# Patient Record
Sex: Male | Born: 1975 | Race: White | Hispanic: No | Marital: Single | Smoking: Current every day smoker
Health system: Southern US, Community
[De-identification: ages and names within clinical notes are randomized; demographics above are authoritative.]

## PROBLEM LIST (undated history)

## (undated) DIAGNOSIS — F209 Schizophrenia, unspecified: Secondary | ICD-10-CM

## (undated) DIAGNOSIS — F909 Attention-deficit hyperactivity disorder, unspecified type: Secondary | ICD-10-CM

## (undated) DIAGNOSIS — E785 Hyperlipidemia, unspecified: Secondary | ICD-10-CM

## (undated) HISTORY — PX: ELBOW SURGERY: SHX618

---

## 2005-06-27 ENCOUNTER — Emergency Department: Payer: Self-pay | Admitting: Emergency Medicine

## 2005-06-30 ENCOUNTER — Emergency Department: Payer: Self-pay | Admitting: General Practice

## 2006-01-27 ENCOUNTER — Emergency Department: Payer: Self-pay | Admitting: Emergency Medicine

## 2006-02-04 ENCOUNTER — Emergency Department: Payer: Self-pay | Admitting: Emergency Medicine

## 2006-04-03 ENCOUNTER — Emergency Department: Payer: Self-pay | Admitting: Unknown Physician Specialty

## 2006-08-06 ENCOUNTER — Emergency Department: Payer: Self-pay | Admitting: Emergency Medicine

## 2006-09-02 ENCOUNTER — Emergency Department: Payer: Self-pay | Admitting: Emergency Medicine

## 2006-12-10 ENCOUNTER — Emergency Department: Payer: Self-pay | Admitting: Emergency Medicine

## 2007-12-02 ENCOUNTER — Other Ambulatory Visit: Payer: Self-pay

## 2007-12-02 ENCOUNTER — Inpatient Hospital Stay: Payer: Self-pay | Admitting: Internal Medicine

## 2008-01-07 ENCOUNTER — Inpatient Hospital Stay: Payer: Self-pay | Admitting: Psychiatry

## 2008-01-07 ENCOUNTER — Other Ambulatory Visit: Payer: Self-pay

## 2008-03-28 ENCOUNTER — Emergency Department: Payer: Self-pay | Admitting: Emergency Medicine

## 2008-04-02 ENCOUNTER — Inpatient Hospital Stay: Payer: Self-pay | Admitting: Internal Medicine

## 2008-04-02 ENCOUNTER — Other Ambulatory Visit: Payer: Self-pay

## 2008-04-03 ENCOUNTER — Other Ambulatory Visit: Payer: Self-pay

## 2008-11-04 ENCOUNTER — Inpatient Hospital Stay: Payer: Self-pay | Admitting: Psychiatry

## 2009-06-07 ENCOUNTER — Emergency Department: Payer: Self-pay | Admitting: Emergency Medicine

## 2009-07-02 ENCOUNTER — Emergency Department: Payer: Self-pay | Admitting: Emergency Medicine

## 2009-07-30 ENCOUNTER — Emergency Department: Payer: Self-pay

## 2010-07-15 ENCOUNTER — Emergency Department: Payer: Self-pay | Admitting: Emergency Medicine

## 2010-09-09 ENCOUNTER — Emergency Department: Payer: Self-pay | Admitting: Emergency Medicine

## 2010-09-19 ENCOUNTER — Emergency Department: Payer: Self-pay | Admitting: Emergency Medicine

## 2010-10-14 ENCOUNTER — Inpatient Hospital Stay: Payer: Self-pay | Admitting: Psychiatry

## 2012-01-04 ENCOUNTER — Ambulatory Visit: Payer: Self-pay | Admitting: Internal Medicine

## 2012-01-20 ENCOUNTER — Inpatient Hospital Stay: Payer: Self-pay | Admitting: Internal Medicine

## 2012-01-20 LAB — DRUG SCREEN, URINE
Amphetamines, Ur Screen: NEGATIVE (ref ?–1000)
Barbiturates, Ur Screen: NEGATIVE (ref ?–200)
Benzodiazepine, Ur Scrn: NEGATIVE (ref ?–200)
Methadone, Ur Screen: NEGATIVE (ref ?–300)
Opiate, Ur Screen: POSITIVE (ref ?–300)
Phencyclidine (PCP) Ur S: POSITIVE (ref ?–25)
Tricyclic, Ur Screen: NEGATIVE (ref ?–1000)

## 2012-01-20 LAB — CBC
MCHC: 32.2 g/dL (ref 32.0–36.0)
MCV: 91 fL (ref 80–100)
Platelet: 159 10*3/uL (ref 150–440)
RBC: 4.34 10*6/uL — ABNORMAL LOW (ref 4.40–5.90)
RDW: 14.6 % — ABNORMAL HIGH (ref 11.5–14.5)
WBC: 11.5 10*3/uL — ABNORMAL HIGH (ref 3.8–10.6)

## 2012-01-20 LAB — TSH: Thyroid Stimulating Horm: 0.93 u[IU]/mL

## 2012-01-20 LAB — CK TOTAL AND CKMB (NOT AT ARMC)
CK, Total: 94553 U/L — ABNORMAL HIGH (ref 35–232)
CK-MB: 13.7 ng/mL — ABNORMAL HIGH (ref 0.5–3.6)

## 2012-01-20 LAB — COMPREHENSIVE METABOLIC PANEL
Anion Gap: 15 (ref 7–16)
BUN: 30 mg/dL — ABNORMAL HIGH (ref 7–18)
Bilirubin,Total: 0.4 mg/dL (ref 0.2–1.0)
Chloride: 115 mmol/L — ABNORMAL HIGH (ref 98–107)
Co2: 16 mmol/L — ABNORMAL LOW (ref 21–32)
Creatinine: 3.88 mg/dL — ABNORMAL HIGH (ref 0.60–1.30)
EGFR (African American): 22 — ABNORMAL LOW
EGFR (Non-African Amer.): 19 — ABNORMAL LOW
Glucose: 107 mg/dL — ABNORMAL HIGH (ref 65–99)
Osmolality: 297 (ref 275–301)
Potassium: 3.7 mmol/L (ref 3.5–5.1)
SGOT(AST): 966 U/L — ABNORMAL HIGH (ref 15–37)
SGPT (ALT): 185 U/L — ABNORMAL HIGH
Sodium: 146 mmol/L — ABNORMAL HIGH (ref 136–145)
Total Protein: 6.9 g/dL (ref 6.4–8.2)

## 2012-01-20 LAB — ACETAMINOPHEN LEVEL: Acetaminophen: <2 ug/mL

## 2012-01-20 LAB — SALICYLATE LEVEL: Salicylates, Serum: <1.7 mg/dL

## 2012-01-20 LAB — ETHANOL
Ethanol %: <0.003 % (ref 0.000–0.080)
Ethanol: <3 mg/dL

## 2012-01-20 LAB — URINALYSIS, COMPLETE
Leukocyte Esterase: NEGATIVE
Nitrite: NEGATIVE
Ph: 5 (ref 4.5–8.0)
Protein: 100
RBC,UR: 1 /HPF (ref 0–5)
Squamous Epithelial: <1

## 2012-01-20 LAB — PROTIME-INR: INR: 1

## 2012-01-20 LAB — TROPONIN I: Troponin-I: 0.41 ng/mL — ABNORMAL HIGH

## 2012-01-21 DIAGNOSIS — I369 Nonrheumatic tricuspid valve disorder, unspecified: Secondary | ICD-10-CM

## 2012-01-21 LAB — BASIC METABOLIC PANEL
Anion Gap: 11 (ref 7–16)
BUN: 38 mg/dL — ABNORMAL HIGH (ref 7–18)
Calcium, Total: 6.5 mg/dL — CL (ref 8.5–10.1)
Chloride: 115 mmol/L — ABNORMAL HIGH (ref 98–107)
Chloride: 112 mmol/L — ABNORMAL HIGH (ref 98–107)
Co2: 22 mmol/L (ref 21–32)
Co2: 25 mmol/L (ref 21–32)
Creatinine: 5.45 mg/dL — ABNORMAL HIGH (ref 0.60–1.30)
Creatinine: 5.98 mg/dL — ABNORMAL HIGH (ref 0.60–1.30)
EGFR (African American): 17 — ABNORMAL LOW
EGFR (African American): 15 — ABNORMAL LOW
EGFR (African American): 13 — ABNORMAL LOW
EGFR (Non-African Amer.): 15 — ABNORMAL LOW
Glucose: 146 mg/dL — ABNORMAL HIGH (ref 65–99)
Osmolality: 306 (ref 275–301)
Osmolality: 298 (ref 275–301)
Potassium: 3.5 mmol/L (ref 3.5–5.1)
Potassium: 3.1 mmol/L — ABNORMAL LOW (ref 3.5–5.1)
Potassium: 3.6 mmol/L (ref 3.5–5.1)
Sodium: 146 mmol/L — ABNORMAL HIGH (ref 136–145)

## 2012-01-21 LAB — TROPONIN I
Troponin-I: 0.48 ng/mL — ABNORMAL HIGH
Troponin-I: 0.32 ng/mL — ABNORMAL HIGH

## 2012-01-21 LAB — CBC WITH DIFFERENTIAL/PLATELET
Basophil #: 0 10*3/uL (ref 0.0–0.1)
Eosinophil #: 0 10*3/uL (ref 0.0–0.7)
Eosinophil %: 0 %
Lymphocyte #: 1.4 10*3/uL (ref 1.0–3.6)
MCH: 29 pg (ref 26.0–34.0)
MCHC: 31.7 g/dL — ABNORMAL LOW (ref 32.0–36.0)
MCV: 91 fL (ref 80–100)
Monocyte #: 0.8 x10 3/mm (ref 0.2–1.0)
Neutrophil %: 75.1 %
Platelet: 82 10*3/uL — ABNORMAL LOW (ref 150–440)
RBC: 4.09 10*6/uL — ABNORMAL LOW (ref 4.40–5.90)
RDW: 14.7 % — ABNORMAL HIGH (ref 11.5–14.5)

## 2012-01-21 LAB — CK TOTAL AND CKMB (NOT AT ARMC): CK-MB: 22.1 ng/mL — ABNORMAL HIGH (ref 0.5–3.6)

## 2012-01-22 LAB — CBC WITH DIFFERENTIAL/PLATELET
Basophil #: 0 10*3/uL (ref 0.0–0.1)
Comment - H1-Com2: NORMAL
Eosinophil %: 0.7 %
Eosinophil: 1 %
HCT: 32.9 % — ABNORMAL LOW (ref 40.0–52.0)
HGB: 10.8 g/dL — ABNORMAL LOW (ref 13.0–18.0)
Lymphocyte #: 1.8 10*3/uL (ref 1.0–3.6)
MCH: 29.5 pg (ref 26.0–34.0)
Monocyte #: 0.4 x10 3/mm (ref 0.2–1.0)
Monocyte %: 4.6 %
Monocytes: 6 %
Neutrophil #: 7.2 10*3/uL — ABNORMAL HIGH (ref 1.4–6.5)
RBC: 3.67 10*6/uL — ABNORMAL LOW (ref 4.40–5.90)
RDW: 14.1 % (ref 11.5–14.5)
Segmented Neutrophils: 67 %
WBC: 9.6 10*3/uL (ref 3.8–10.6)

## 2012-01-22 LAB — URINE CULTURE

## 2012-01-22 LAB — BASIC METABOLIC PANEL
BUN: 37 mg/dL — ABNORMAL HIGH (ref 7–18)
Calcium, Total: 6.4 mg/dL — CL (ref 8.5–10.1)
Co2: 28 mmol/L (ref 21–32)
Creatinine: 6.74 mg/dL — ABNORMAL HIGH (ref 0.60–1.30)
EGFR (African American): 11 — ABNORMAL LOW
Glucose: 101 mg/dL — ABNORMAL HIGH (ref 65–99)
Sodium: 138 mmol/L (ref 136–145)

## 2012-01-22 LAB — COMPREHENSIVE METABOLIC PANEL
Albumin: 2.3 g/dL — ABNORMAL LOW (ref 3.4–5.0)
Alkaline Phosphatase: 62 U/L (ref 50–136)
Anion Gap: 9 (ref 7–16)
BUN: 37 mg/dL — ABNORMAL HIGH (ref 7–18)
Bilirubin,Total: 0.5 mg/dL (ref 0.2–1.0)
Calcium, Total: 6.1 mg/dL — CL (ref 8.5–10.1)
Creatinine: 6.33 mg/dL — ABNORMAL HIGH (ref 0.60–1.30)
EGFR (Non-African Amer.): 11 — ABNORMAL LOW
Glucose: 115 mg/dL — ABNORMAL HIGH (ref 65–99)
SGPT (ALT): 215 U/L — ABNORMAL HIGH
Total Protein: 5.2 g/dL — ABNORMAL LOW (ref 6.4–8.2)

## 2012-01-22 LAB — URIC ACID: Uric Acid: 11.5 mg/dL — ABNORMAL HIGH (ref 3.5–7.2)

## 2012-01-22 LAB — RETICULOCYTES
Absolute Retic Count: 0.067 10*6/uL (ref 0.031–0.129)
Reticulocyte: 1.8 % (ref 0.7–2.5)

## 2012-01-22 LAB — PROTIME-INR
INR: 0.9
Prothrombin Time: 12.8 secs (ref 11.5–14.7)

## 2012-01-22 LAB — APTT: Activated PTT: 34 secs (ref 23.6–35.9)

## 2012-01-22 LAB — LACTATE DEHYDROGENASE: LDH: 2384 U/L — ABNORMAL HIGH (ref 81–234)

## 2012-01-22 LAB — IRON AND TIBC: Iron Saturation: 42 %

## 2012-01-22 LAB — PHOSPHORUS: Phosphorus: 3.1 mg/dL (ref 2.5–4.9)

## 2012-01-22 LAB — CK: CK, Total: 59866 U/L — ABNORMAL HIGH (ref 35–232)

## 2012-01-22 LAB — FOLATE: Folic Acid: 13.9 ng/mL (ref 3.1–100.0)

## 2012-01-22 LAB — RAPID HIV-1/2 QL/CONFIRM: HIV-1/2,Rapid Ql: NEGATIVE

## 2012-01-23 LAB — CBC WITH DIFFERENTIAL/PLATELET
Basophil #: 0 10*3/uL (ref 0.0–0.1)
Basophil %: 0.3 %
Eosinophil #: 0.1 10*3/uL (ref 0.0–0.7)
HCT: 33.1 % — ABNORMAL LOW (ref 40.0–52.0)
Lymphocyte #: 1.1 10*3/uL (ref 1.0–3.6)
Lymphocyte %: 15.1 %
MCH: 29.8 pg (ref 26.0–34.0)
MCHC: 33.4 g/dL (ref 32.0–36.0)
MCV: 89 fL (ref 80–100)
Monocyte #: 0.4 x10 3/mm (ref 0.2–1.0)
Neutrophil #: 5.7 10*3/uL (ref 1.4–6.5)
Platelet: 85 10*3/uL — ABNORMAL LOW (ref 150–440)
RDW: 13.9 % (ref 11.5–14.5)
WBC: 7.4 10*3/uL (ref 3.8–10.6)

## 2012-01-23 LAB — COMPREHENSIVE METABOLIC PANEL
Albumin: 2.4 g/dL — ABNORMAL LOW (ref 3.4–5.0)
Alkaline Phosphatase: 68 U/L (ref 50–136)
Anion Gap: 8 (ref 7–16)
Bilirubin,Total: 0.8 mg/dL (ref 0.2–1.0)
Calcium, Total: 6.6 mg/dL — CL (ref 8.5–10.1)
Chloride: 103 mmol/L (ref 98–107)
Creatinine: 7.55 mg/dL — ABNORMAL HIGH (ref 0.60–1.30)
EGFR (Non-African Amer.): 8 — ABNORMAL LOW
Glucose: 94 mg/dL (ref 65–99)
Osmolality: 291 (ref 275–301)
Potassium: 3.4 mmol/L — ABNORMAL LOW (ref 3.5–5.1)
Sodium: 141 mmol/L (ref 136–145)
Total Protein: 5.1 g/dL — ABNORMAL LOW (ref 6.4–8.2)

## 2012-01-23 LAB — CK: CK, Total: 44049 U/L — ABNORMAL HIGH (ref 35–232)

## 2012-01-24 LAB — BASIC METABOLIC PANEL
Calcium, Total: 7.6 mg/dL — ABNORMAL LOW (ref 8.5–10.1)
Chloride: 100 mmol/L (ref 98–107)
Co2: 29 mmol/L (ref 21–32)
EGFR (African American): 12 — ABNORMAL LOW
EGFR (Non-African Amer.): 10 — ABNORMAL LOW
Glucose: 109 mg/dL — ABNORMAL HIGH (ref 65–99)
Osmolality: 290 (ref 275–301)
Sodium: 139 mmol/L (ref 136–145)

## 2012-01-24 LAB — CBC WITH DIFFERENTIAL/PLATELET
Basophil #: 0 10*3/uL (ref 0.0–0.1)
Basophil %: 0.1 %
Eosinophil #: 0.3 10*3/uL (ref 0.0–0.7)
Eosinophil %: 4.6 %
HCT: 32.8 % — ABNORMAL LOW (ref 40.0–52.0)
HGB: 11.1 g/dL — ABNORMAL LOW (ref 13.0–18.0)
Lymphocyte #: 0.8 10*3/uL — ABNORMAL LOW (ref 1.0–3.6)
Lymphocyte %: 10.9 %
MCH: 29.9 pg (ref 26.0–34.0)
MCV: 88 fL (ref 80–100)
Monocyte %: 5.1 %
Platelet: 106 10*3/uL — ABNORMAL LOW (ref 150–440)
RBC: 3.71 10*6/uL — ABNORMAL LOW (ref 4.40–5.90)
RDW: 13.7 % (ref 11.5–14.5)
WBC: 7.3 10*3/uL (ref 3.8–10.6)

## 2012-01-24 LAB — RENAL FUNCTION PANEL
Albumin: 2.3 g/dL — ABNORMAL LOW (ref 3.4–5.0)
Anion Gap: 10 (ref 7–16)
Chloride: 100 mmol/L (ref 98–107)
EGFR (African American): 12 — ABNORMAL LOW
EGFR (Non-African Amer.): 10 — ABNORMAL LOW
Potassium: 3.5 mmol/L (ref 3.5–5.1)
Sodium: 139 mmol/L (ref 136–145)

## 2012-01-24 LAB — URIC ACID: Uric Acid: 11.9 mg/dL — ABNORMAL HIGH (ref 3.5–7.2)

## 2012-01-24 LAB — LACTATE DEHYDROGENASE: LDH: 864 U/L — ABNORMAL HIGH (ref 81–234)

## 2012-01-24 LAB — CK: CK, Total: 17988 U/L — ABNORMAL HIGH (ref 35–232)

## 2012-01-25 LAB — CBC WITH DIFFERENTIAL/PLATELET
Eosinophil #: 0.4 10*3/uL (ref 0.0–0.7)
HCT: 32.3 % — ABNORMAL LOW (ref 40.0–52.0)
Lymphocyte #: 0.9 10*3/uL — ABNORMAL LOW (ref 1.0–3.6)
Lymphocyte %: 12.3 %
MCH: 30.2 pg (ref 26.0–34.0)
MCHC: 33.5 g/dL (ref 32.0–36.0)
MCV: 90 fL (ref 80–100)
Monocyte #: 0.5 x10 3/mm (ref 0.2–1.0)
Neutrophil #: 5.2 10*3/uL (ref 1.4–6.5)
Platelet: 123 10*3/uL — ABNORMAL LOW (ref 150–440)
RDW: 13.4 % (ref 11.5–14.5)

## 2012-01-25 LAB — RENAL FUNCTION PANEL
Albumin: 2.3 g/dL — ABNORMAL LOW (ref 3.4–5.0)
Anion Gap: 11 (ref 7–16)
BUN: 37 mg/dL — ABNORMAL HIGH (ref 7–18)
Chloride: 104 mmol/L (ref 98–107)
Co2: 27 mmol/L (ref 21–32)
Creatinine: 4.93 mg/dL — ABNORMAL HIGH (ref 0.60–1.30)
EGFR (African American): 16 — ABNORMAL LOW
EGFR (Non-African Amer.): 14 — ABNORMAL LOW
Potassium: 3.7 mmol/L (ref 3.5–5.1)
Sodium: 142 mmol/L (ref 136–145)

## 2012-01-25 LAB — COMPREHENSIVE METABOLIC PANEL
Alkaline Phosphatase: 64 U/L (ref 50–136)
Bilirubin,Total: 0.5 mg/dL (ref 0.2–1.0)
SGOT(AST): 168 U/L — ABNORMAL HIGH (ref 15–37)
Total Protein: 5.6 g/dL — ABNORMAL LOW (ref 6.4–8.2)

## 2012-01-25 LAB — CK: CK, Total: 10607 U/L — ABNORMAL HIGH (ref 35–232)

## 2012-01-25 LAB — URIC ACID: Uric Acid: 11.2 mg/dL — ABNORMAL HIGH (ref 3.5–7.2)

## 2012-01-26 LAB — CREATININE, SERUM: Creatinine: 3.48 mg/dL — ABNORMAL HIGH (ref 0.60–1.30)

## 2012-01-26 LAB — CULTURE, BLOOD (SINGLE)

## 2012-01-26 LAB — CK: CK, Total: 5929 U/L — ABNORMAL HIGH (ref 35–232)

## 2012-01-27 LAB — RENAL FUNCTION PANEL
Anion Gap: 5 — ABNORMAL LOW (ref 7–16)
BUN: 19 mg/dL — ABNORMAL HIGH (ref 7–18)
EGFR (African American): 38 — ABNORMAL LOW
EGFR (Non-African Amer.): 33 — ABNORMAL LOW
Glucose: 125 mg/dL — ABNORMAL HIGH (ref 65–99)
Phosphorus: 4.2 mg/dL (ref 2.5–4.9)
Potassium: 3.6 mmol/L (ref 3.5–5.1)
Sodium: 140 mmol/L (ref 136–145)

## 2012-01-27 LAB — CBC WITH DIFFERENTIAL/PLATELET
Basophil %: 0.5 %
Eosinophil %: 4.5 %
HGB: 10.3 g/dL — ABNORMAL LOW (ref 13.0–18.0)
Lymphocyte #: 1 10*3/uL (ref 1.0–3.6)
Lymphocyte %: 18.2 %
MCH: 29.4 pg (ref 26.0–34.0)
MCHC: 32.5 g/dL (ref 32.0–36.0)
Monocyte #: 0.6 x10 3/mm (ref 0.2–1.0)
Monocyte %: 10.7 %
Neutrophil %: 66.1 %
Platelet: 210 10*3/uL (ref 150–440)
RDW: 13.9 % (ref 11.5–14.5)

## 2012-01-28 LAB — BASIC METABOLIC PANEL
Anion Gap: 8 (ref 7–16)
BUN: 18 mg/dL (ref 7–18)
Co2: 24 mmol/L (ref 21–32)
Creatinine: 1.88 mg/dL — ABNORMAL HIGH (ref 0.60–1.30)
EGFR (African American): 53 — ABNORMAL LOW
EGFR (Non-African Amer.): 46 — ABNORMAL LOW
Glucose: 92 mg/dL (ref 65–99)
Osmolality: 281 (ref 275–301)
Sodium: 140 mmol/L (ref 136–145)

## 2012-01-28 LAB — CK: CK, Total: 1646 U/L — ABNORMAL HIGH (ref 35–232)

## 2012-02-04 ENCOUNTER — Ambulatory Visit: Payer: Self-pay | Admitting: Internal Medicine

## 2012-03-22 ENCOUNTER — Emergency Department: Payer: Self-pay | Admitting: Emergency Medicine

## 2012-03-22 LAB — DRUG SCREEN, URINE
Barbiturates, Ur Screen: NEGATIVE (ref ?–200)
Benzodiazepine, Ur Scrn: NEGATIVE (ref ?–200)
Cocaine Metabolite,Ur ~~LOC~~: NEGATIVE (ref ?–300)
MDMA (Ecstasy)Ur Screen: NEGATIVE (ref ?–500)
Methadone, Ur Screen: NEGATIVE (ref ?–300)
Opiate, Ur Screen: NEGATIVE (ref ?–300)
Phencyclidine (PCP) Ur S: POSITIVE (ref ?–25)
Tricyclic, Ur Screen: NEGATIVE (ref ?–1000)

## 2012-03-22 LAB — COMPREHENSIVE METABOLIC PANEL
Alkaline Phosphatase: 132 U/L (ref 50–136)
Anion Gap: 10 (ref 7–16)
BUN: 25 mg/dL — ABNORMAL HIGH (ref 7–18)
Bilirubin,Total: 1 mg/dL (ref 0.2–1.0)
Calcium, Total: 9.1 mg/dL (ref 8.5–10.1)
Chloride: 108 mmol/L — ABNORMAL HIGH (ref 98–107)
Co2: 22 mmol/L (ref 21–32)
Creatinine: 1.49 mg/dL — ABNORMAL HIGH (ref 0.60–1.30)
EGFR (Non-African Amer.): >60
Osmolality: 284 (ref 275–301)
Potassium: 4 mmol/L (ref 3.5–5.1)
Sodium: 140 mmol/L (ref 136–145)

## 2012-03-22 LAB — CBC
HCT: 37.8 % — ABNORMAL LOW (ref 40.0–52.0)
HGB: 12.9 g/dL — ABNORMAL LOW (ref 13.0–18.0)
MCHC: 34.3 g/dL (ref 32.0–36.0)
MCV: 88 fL (ref 80–100)
RDW: 14.4 % (ref 11.5–14.5)
WBC: 11.5 10*3/uL — ABNORMAL HIGH (ref 3.8–10.6)

## 2012-03-22 LAB — URINALYSIS, COMPLETE
Bilirubin,UR: NEGATIVE
Blood: NEGATIVE
Hyaline Cast: 9
Nitrite: NEGATIVE
Protein: 100
Specific Gravity: 1.031 (ref 1.003–1.030)
Squamous Epithelial: NONE SEEN
WBC UR: 9 /HPF (ref 0–5)

## 2012-03-22 LAB — ETHANOL
Ethanol %: <0.003 % (ref 0.000–0.080)
Ethanol: <3 mg/dL

## 2012-11-05 ENCOUNTER — Emergency Department: Payer: Self-pay | Admitting: Emergency Medicine

## 2012-11-05 LAB — COMPREHENSIVE METABOLIC PANEL
Albumin: 4.2 g/dL (ref 3.4–5.0)
Alkaline Phosphatase: 101 U/L (ref 50–136)
Anion Gap: 14 (ref 7–16)
BUN: 24 mg/dL — ABNORMAL HIGH (ref 7–18)
Co2: 19 mmol/L — ABNORMAL LOW (ref 21–32)
Creatinine: 1.46 mg/dL — ABNORMAL HIGH (ref 0.60–1.30)
EGFR (African American): >60
Osmolality: 282 (ref 275–301)
Potassium: 3.4 mmol/L — ABNORMAL LOW (ref 3.5–5.1)
SGOT(AST): 84 U/L — ABNORMAL HIGH (ref 15–37)
SGPT (ALT): 46 U/L (ref 12–78)
Total Protein: 8 g/dL (ref 6.4–8.2)

## 2012-11-05 LAB — BASIC METABOLIC PANEL
Calcium, Total: 8.3 mg/dL — ABNORMAL LOW (ref 8.5–10.1)
Chloride: 110 mmol/L — ABNORMAL HIGH (ref 98–107)
Co2: 23 mmol/L (ref 21–32)
Creatinine: 1.1 mg/dL (ref 0.60–1.30)
EGFR (African American): >60
EGFR (Non-African Amer.): >60
Glucose: 91 mg/dL (ref 65–99)
Osmolality: 279 (ref 275–301)
Potassium: 3.8 mmol/L (ref 3.5–5.1)

## 2012-11-05 LAB — ETHANOL: Ethanol: <3 mg/dL

## 2012-11-05 LAB — URINALYSIS, COMPLETE
Nitrite: NEGATIVE
Ph: 5 (ref 4.5–8.0)
Protein: 30
WBC UR: 3 /HPF (ref 0–5)

## 2012-11-05 LAB — CBC
HCT: 41.3 % (ref 40.0–52.0)
HGB: 14.1 g/dL (ref 13.0–18.0)
MCH: 29.4 pg (ref 26.0–34.0)
MCHC: 34 g/dL (ref 32.0–36.0)
MCV: 86 fL (ref 80–100)
Platelet: 236 10*3/uL (ref 150–440)
RDW: 13.3 % (ref 11.5–14.5)
WBC: 14.5 10*3/uL — ABNORMAL HIGH (ref 3.8–10.6)

## 2012-11-05 LAB — CK
CK, Total: 4553 U/L — ABNORMAL HIGH (ref 35–232)
CK, Total: 4279 U/L — ABNORMAL HIGH (ref 35–232)

## 2012-11-05 LAB — DRUG SCREEN, URINE
Amphetamines, Ur Screen: NEGATIVE (ref ?–1000)
Barbiturates, Ur Screen: NEGATIVE (ref ?–200)
Benzodiazepine, Ur Scrn: NEGATIVE (ref ?–200)
Cocaine Metabolite,Ur ~~LOC~~: NEGATIVE (ref ?–300)
MDMA (Ecstasy)Ur Screen: NEGATIVE (ref ?–500)
Methadone, Ur Screen: NEGATIVE (ref ?–300)
Opiate, Ur Screen: NEGATIVE (ref ?–300)

## 2012-11-05 LAB — TSH: Thyroid Stimulating Horm: 2.71 u[IU]/mL

## 2012-11-05 LAB — SALICYLATE LEVEL: Salicylates, Serum: 1.8 mg/dL

## 2012-11-06 LAB — CK
CK, Total: 5725 U/L — ABNORMAL HIGH (ref 35–232)
CK, Total: 4258 U/L — ABNORMAL HIGH (ref 35–232)

## 2012-11-06 LAB — BASIC METABOLIC PANEL
Anion Gap: 9 (ref 7–16)
BUN: 11 mg/dL (ref 7–18)
Creatinine: 1.11 mg/dL (ref 0.60–1.30)
EGFR (African American): >60
Osmolality: 275 (ref 275–301)

## 2012-11-08 LAB — CBC WITH DIFFERENTIAL/PLATELET
Basophil %: 0.2 %
Eosinophil #: 0.2 10*3/uL (ref 0.0–0.7)
Eosinophil %: 2.2 %
HCT: 41.9 % (ref 40.0–52.0)
HGB: 14.2 g/dL (ref 13.0–18.0)
Lymphocyte #: 0.9 10*3/uL — ABNORMAL LOW (ref 1.0–3.6)
Lymphocyte %: 10.6 %
MCH: 29.6 pg (ref 26.0–34.0)
MCHC: 33.8 g/dL (ref 32.0–36.0)
Monocyte #: 0.7 x10 3/mm (ref 0.2–1.0)
Neutrophil %: 78.6 %
Platelet: 205 10*3/uL (ref 150–440)
RBC: 4.79 10*6/uL (ref 4.40–5.90)
RDW: 13.5 % (ref 11.5–14.5)
WBC: 8.5 10*3/uL (ref 3.8–10.6)

## 2012-11-08 LAB — COMPREHENSIVE METABOLIC PANEL
Albumin: 4.1 g/dL (ref 3.4–5.0)
Anion Gap: 5 — ABNORMAL LOW (ref 7–16)
BUN: 11 mg/dL (ref 7–18)
Bilirubin,Total: 0.5 mg/dL (ref 0.2–1.0)
Calcium, Total: 9.4 mg/dL (ref 8.5–10.1)
Chloride: 106 mmol/L (ref 98–107)
Co2: 26 mmol/L (ref 21–32)
Creatinine: 0.97 mg/dL (ref 0.60–1.30)
EGFR (African American): >60
EGFR (Non-African Amer.): >60
Potassium: 4.3 mmol/L (ref 3.5–5.1)
SGOT(AST): 72 U/L — ABNORMAL HIGH (ref 15–37)
SGPT (ALT): 47 U/L (ref 12–78)
Sodium: 137 mmol/L (ref 136–145)
Total Protein: 8.2 g/dL (ref 6.4–8.2)

## 2012-11-08 LAB — URINALYSIS, COMPLETE
Bacteria: NONE SEEN
Bilirubin,UR: NEGATIVE
Blood: NEGATIVE
Nitrite: NEGATIVE
Squamous Epithelial: NONE SEEN

## 2012-11-08 LAB — DRUG SCREEN, URINE
Amphetamines, Ur Screen: NEGATIVE (ref ?–1000)
Barbiturates, Ur Screen: NEGATIVE (ref ?–200)
Benzodiazepine, Ur Scrn: POSITIVE (ref ?–200)
Cannabinoid 50 Ng, Ur ~~LOC~~: NEGATIVE (ref ?–50)
Opiate, Ur Screen: NEGATIVE (ref ?–300)

## 2012-11-12 ENCOUNTER — Emergency Department: Payer: Self-pay | Admitting: Unknown Physician Specialty

## 2012-11-12 LAB — ETHANOL
Ethanol %: <0.003 % (ref 0.000–0.080)
Ethanol: <3 mg/dL

## 2012-11-12 LAB — COMPREHENSIVE METABOLIC PANEL
Albumin: 4.3 g/dL (ref 3.4–5.0)
Anion Gap: 11 (ref 7–16)
BUN: 40 mg/dL — ABNORMAL HIGH (ref 7–18)
Co2: 23 mmol/L (ref 21–32)
EGFR (African American): >60
Glucose: 104 mg/dL — ABNORMAL HIGH (ref 65–99)
SGOT(AST): 72 U/L — ABNORMAL HIGH (ref 15–37)
Sodium: 141 mmol/L (ref 136–145)
Total Protein: 8.5 g/dL — ABNORMAL HIGH (ref 6.4–8.2)

## 2012-11-12 LAB — CK: CK, Total: 2534 U/L — ABNORMAL HIGH (ref 35–232)

## 2012-11-12 LAB — CBC
HGB: 13.5 g/dL (ref 13.0–18.0)
MCH: 29.5 pg (ref 26.0–34.0)
MCHC: 33.8 g/dL (ref 32.0–36.0)
RDW: 13.5 % (ref 11.5–14.5)

## 2012-11-12 LAB — TSH: Thyroid Stimulating Horm: 1.06 u[IU]/mL

## 2012-11-13 LAB — DRUG SCREEN, URINE
Barbiturates, Ur Screen: NEGATIVE (ref ?–200)
Benzodiazepine, Ur Scrn: POSITIVE (ref ?–200)
MDMA (Ecstasy)Ur Screen: NEGATIVE (ref ?–500)
Opiate, Ur Screen: NEGATIVE (ref ?–300)
Phencyclidine (PCP) Ur S: NEGATIVE (ref ?–25)

## 2012-11-13 LAB — URINALYSIS, COMPLETE
Bacteria: NONE SEEN
Bilirubin,UR: NEGATIVE
Glucose,UR: NEGATIVE mg/dL (ref 0–75)
Leukocyte Esterase: NEGATIVE
Nitrite: NEGATIVE
Ph: 5 (ref 4.5–8.0)
RBC,UR: <1 /HPF (ref 0–5)
Squamous Epithelial: NONE SEEN
WBC UR: 1 /HPF (ref 0–5)

## 2012-11-13 LAB — BASIC METABOLIC PANEL
Anion Gap: 8 (ref 7–16)
BUN: 29 mg/dL — ABNORMAL HIGH (ref 7–18)
Chloride: 113 mmol/L — ABNORMAL HIGH (ref 98–107)
Creatinine: 1.01 mg/dL (ref 0.60–1.30)
Glucose: 77 mg/dL (ref 65–99)
Sodium: 142 mmol/L (ref 136–145)

## 2012-11-13 LAB — CK: CK, Total: 2691 U/L — ABNORMAL HIGH (ref 35–232)

## 2012-11-14 LAB — COMPREHENSIVE METABOLIC PANEL
Alkaline Phosphatase: 72 U/L (ref 50–136)
Anion Gap: 7 (ref 7–16)
Bilirubin,Total: 1.2 mg/dL — ABNORMAL HIGH (ref 0.2–1.0)
Chloride: 112 mmol/L — ABNORMAL HIGH (ref 98–107)
Co2: 23 mmol/L (ref 21–32)
Creatinine: 1.12 mg/dL (ref 0.60–1.30)
EGFR (African American): >60
Glucose: 77 mg/dL (ref 65–99)
Osmolality: 285 (ref 275–301)
Potassium: 3.8 mmol/L (ref 3.5–5.1)
SGOT(AST): 74 U/L — ABNORMAL HIGH (ref 15–37)
Sodium: 142 mmol/L (ref 136–145)
Total Protein: 6.6 g/dL (ref 6.4–8.2)

## 2012-11-15 LAB — CK: CK, Total: 3049 U/L — ABNORMAL HIGH (ref 35–232)

## 2013-02-07 ENCOUNTER — Emergency Department: Payer: Self-pay | Admitting: Emergency Medicine

## 2013-02-07 LAB — URINALYSIS, COMPLETE
Bacteria: NONE SEEN
Bilirubin,UR: NEGATIVE
Glucose,UR: NEGATIVE mg/dL (ref 0–75)
Leukocyte Esterase: NEGATIVE
Nitrite: NEGATIVE
RBC,UR: NONE SEEN /HPF (ref 0–5)
Specific Gravity: 1.009 (ref 1.003–1.030)
Squamous Epithelial: <1
WBC UR: <1 /HPF (ref 0–5)

## 2013-02-07 LAB — COMPREHENSIVE METABOLIC PANEL
Alkaline Phosphatase: 111 U/L (ref 50–136)
Calcium, Total: 8.3 mg/dL — ABNORMAL LOW (ref 8.5–10.1)
Chloride: 109 mmol/L — ABNORMAL HIGH (ref 98–107)
Co2: 25 mmol/L (ref 21–32)
Creatinine: 1.1 mg/dL (ref 0.60–1.30)
EGFR (African American): >60
Glucose: 98 mg/dL (ref 65–99)
Total Protein: 7.2 g/dL (ref 6.4–8.2)

## 2013-02-07 LAB — DRUG SCREEN, URINE
Amphetamines, Ur Screen: NEGATIVE (ref ?–1000)
Barbiturates, Ur Screen: NEGATIVE (ref ?–200)
Cannabinoid 50 Ng, Ur ~~LOC~~: NEGATIVE (ref ?–50)
MDMA (Ecstasy)Ur Screen: NEGATIVE (ref ?–500)
Methadone, Ur Screen: NEGATIVE (ref ?–300)
Tricyclic, Ur Screen: NEGATIVE (ref ?–1000)

## 2013-02-07 LAB — CBC
HCT: 41 % (ref 40.0–52.0)
HGB: 14.1 g/dL (ref 13.0–18.0)
MCH: 30.4 pg (ref 26.0–34.0)
MCHC: 34.2 g/dL (ref 32.0–36.0)
MCV: 89 fL (ref 80–100)
Platelet: 199 10*3/uL (ref 150–440)
RBC: 4.62 10*6/uL (ref 4.40–5.90)

## 2013-02-07 LAB — TSH: Thyroid Stimulating Horm: 1.9 u[IU]/mL

## 2013-02-07 LAB — SALICYLATE LEVEL: Salicylates, Serum: <1.7 mg/dL

## 2013-02-07 LAB — ETHANOL
Ethanol %: <0.003 % (ref 0.000–0.080)
Ethanol: <3 mg/dL

## 2013-02-10 ENCOUNTER — Emergency Department: Payer: Self-pay | Admitting: Emergency Medicine

## 2013-02-10 LAB — CBC
HCT: 42.4 % (ref 40.0–52.0)
MCH: 29.9 pg (ref 26.0–34.0)
MCHC: 33.9 g/dL (ref 32.0–36.0)
MCV: 88 fL (ref 80–100)
Platelet: 237 10*3/uL (ref 150–440)
RBC: 4.8 10*6/uL (ref 4.40–5.90)

## 2013-02-10 LAB — URINALYSIS, COMPLETE
Bacteria: NONE SEEN
Blood: NEGATIVE
Glucose,UR: NEGATIVE mg/dL (ref 0–75)
Hyaline Cast: 2
Leukocyte Esterase: NEGATIVE
Ph: 5 (ref 4.5–8.0)
Protein: 30
RBC,UR: NONE SEEN /HPF (ref 0–5)
Squamous Epithelial: NONE SEEN

## 2013-02-10 LAB — COMPREHENSIVE METABOLIC PANEL
Anion Gap: 6 — ABNORMAL LOW (ref 7–16)
Calcium, Total: 8.9 mg/dL (ref 8.5–10.1)
Co2: 20 mmol/L — ABNORMAL LOW (ref 21–32)
Creatinine: 1.05 mg/dL (ref 0.60–1.30)
EGFR (African American): >60
EGFR (Non-African Amer.): >60
Osmolality: 284 (ref 275–301)
SGOT(AST): 72 U/L — ABNORMAL HIGH (ref 15–37)
SGPT (ALT): 43 U/L (ref 12–78)

## 2013-02-10 LAB — ETHANOL: Ethanol: <3 mg/dL

## 2013-02-10 LAB — DRUG SCREEN, URINE
Amphetamines, Ur Screen: NEGATIVE (ref ?–1000)
Barbiturates, Ur Screen: NEGATIVE (ref ?–200)
Benzodiazepine, Ur Scrn: NEGATIVE (ref ?–200)
Cocaine Metabolite,Ur ~~LOC~~: NEGATIVE (ref ?–300)
MDMA (Ecstasy)Ur Screen: NEGATIVE (ref ?–500)
Methadone, Ur Screen: NEGATIVE (ref ?–300)
Opiate, Ur Screen: NEGATIVE (ref ?–300)
Tricyclic, Ur Screen: NEGATIVE (ref ?–1000)

## 2013-02-10 LAB — TSH: Thyroid Stimulating Horm: 0.669 u[IU]/mL

## 2013-02-19 ENCOUNTER — Emergency Department: Payer: Self-pay | Admitting: Emergency Medicine

## 2013-02-19 LAB — COMPREHENSIVE METABOLIC PANEL
Albumin: 3.9 g/dL (ref 3.4–5.0)
Alkaline Phosphatase: 123 U/L (ref 50–136)
BUN: 9 mg/dL (ref 7–18)
Calcium, Total: 9 mg/dL (ref 8.5–10.1)
Chloride: 107 mmol/L (ref 98–107)
Co2: 18 mmol/L — ABNORMAL LOW (ref 21–32)
Creatinine: 1.17 mg/dL (ref 0.60–1.30)
EGFR (Non-African Amer.): >60
Osmolality: 273 (ref 275–301)
Potassium: 2.7 mmol/L — ABNORMAL LOW (ref 3.5–5.1)
SGOT(AST): 53 U/L — ABNORMAL HIGH (ref 15–37)
SGPT (ALT): 48 U/L (ref 12–78)
Sodium: 137 mmol/L (ref 136–145)
Total Protein: 7.5 g/dL (ref 6.4–8.2)

## 2013-02-19 LAB — CBC
HGB: 14.6 g/dL (ref 13.0–18.0)
MCHC: 35.2 g/dL (ref 32.0–36.0)
RBC: 4.83 10*6/uL (ref 4.40–5.90)

## 2013-02-19 LAB — URINALYSIS, COMPLETE
Bacteria: NONE SEEN
Bilirubin,UR: NEGATIVE
Leukocyte Esterase: NEGATIVE
Nitrite: NEGATIVE
RBC,UR: 5 /HPF (ref 0–5)
WBC UR: 8 /HPF (ref 0–5)

## 2013-04-10 ENCOUNTER — Emergency Department: Payer: Self-pay | Admitting: Emergency Medicine

## 2013-04-10 LAB — URINALYSIS, COMPLETE
Bacteria: NONE SEEN
Blood: NEGATIVE
Glucose,UR: NEGATIVE mg/dL (ref 0–75)
Ketone: NEGATIVE
Leukocyte Esterase: NEGATIVE
Nitrite: NEGATIVE
Ph: 5 (ref 4.5–8.0)
RBC,UR: NONE SEEN /HPF (ref 0–5)
Specific Gravity: 1.005 (ref 1.003–1.030)
Squamous Epithelial: NONE SEEN
WBC UR: <1 /HPF (ref 0–5)

## 2013-04-10 LAB — COMPREHENSIVE METABOLIC PANEL
Albumin: 3.7 g/dL (ref 3.4–5.0)
Alkaline Phosphatase: 127 U/L (ref 50–136)
Anion Gap: 6 — ABNORMAL LOW (ref 7–16)
BUN: <1 mg/dL — ABNORMAL LOW (ref 7–18)
Bilirubin,Total: 0.3 mg/dL (ref 0.2–1.0)
Calcium, Total: 8.8 mg/dL (ref 8.5–10.1)
Creatinine: 1.03 mg/dL (ref 0.60–1.30)
EGFR (African American): >60
EGFR (Non-African Amer.): >60
Potassium: 3.5 mmol/L (ref 3.5–5.1)
SGPT (ALT): 25 U/L (ref 12–78)
Sodium: 135 mmol/L — ABNORMAL LOW (ref 136–145)
Total Protein: 7 g/dL (ref 6.4–8.2)

## 2013-04-10 LAB — DRUG SCREEN, URINE
Benzodiazepine, Ur Scrn: NEGATIVE (ref ?–200)
Cannabinoid 50 Ng, Ur ~~LOC~~: POSITIVE (ref ?–50)
Cocaine Metabolite,Ur ~~LOC~~: NEGATIVE (ref ?–300)
MDMA (Ecstasy)Ur Screen: NEGATIVE (ref ?–500)
Methadone, Ur Screen: NEGATIVE (ref ?–300)
Phencyclidine (PCP) Ur S: NEGATIVE (ref ?–25)

## 2013-04-10 LAB — CBC
HCT: 39.2 % — ABNORMAL LOW (ref 40.0–52.0)
MCH: 29.8 pg (ref 26.0–34.0)
MCHC: 33.9 g/dL (ref 32.0–36.0)
RDW: 13.7 % (ref 11.5–14.5)

## 2013-04-10 LAB — ETHANOL: Ethanol %: <0.003 % (ref 0.000–0.080)

## 2013-04-23 ENCOUNTER — Emergency Department: Payer: Self-pay | Admitting: Emergency Medicine

## 2013-04-23 LAB — COMPREHENSIVE METABOLIC PANEL
Albumin: 3.7 g/dL (ref 3.4–5.0)
Alkaline Phosphatase: 109 U/L (ref 50–136)
Anion Gap: 8 (ref 7–16)
BUN: 5 mg/dL — ABNORMAL LOW (ref 7–18)
Bilirubin,Total: 0.1 mg/dL — ABNORMAL LOW (ref 0.2–1.0)
Calcium, Total: 8.7 mg/dL (ref 8.5–10.1)
Chloride: 109 mmol/L — ABNORMAL HIGH (ref 98–107)
Co2: 19 mmol/L — ABNORMAL LOW (ref 21–32)
Creatinine: 1.12 mg/dL (ref 0.60–1.30)
EGFR (African American): >60
EGFR (Non-African Amer.): >60
Glucose: 144 mg/dL — ABNORMAL HIGH (ref 65–99)
Sodium: 136 mmol/L (ref 136–145)

## 2013-04-23 LAB — ETHANOL
Ethanol %: <0.003 % (ref 0.000–0.080)
Ethanol: <3 mg/dL

## 2013-04-23 LAB — URINALYSIS, COMPLETE
Bacteria: NONE SEEN
Blood: NEGATIVE
Glucose,UR: NEGATIVE mg/dL (ref 0–75)
Ph: 5 (ref 4.5–8.0)
Protein: NEGATIVE
Specific Gravity: 1.021 (ref 1.003–1.030)
Squamous Epithelial: NONE SEEN
WBC UR: 2 /HPF (ref 0–5)

## 2013-04-23 LAB — CBC
HGB: 13.8 g/dL (ref 13.0–18.0)
MCH: 30 pg (ref 26.0–34.0)
MCHC: 33.6 g/dL (ref 32.0–36.0)
MCV: 89 fL (ref 80–100)
RBC: 4.6 10*6/uL (ref 4.40–5.90)
RDW: 13.9 % (ref 11.5–14.5)
WBC: 8.4 10*3/uL (ref 3.8–10.6)

## 2013-04-23 LAB — DRUG SCREEN, URINE
Cocaine Metabolite,Ur ~~LOC~~: NEGATIVE (ref ?–300)
Methadone, Ur Screen: NEGATIVE (ref ?–300)
Opiate, Ur Screen: NEGATIVE (ref ?–300)
Tricyclic, Ur Screen: NEGATIVE (ref ?–1000)

## 2013-04-23 LAB — TSH: Thyroid Stimulating Horm: 1.52 u[IU]/mL

## 2013-04-27 ENCOUNTER — Emergency Department: Payer: Self-pay | Admitting: Emergency Medicine

## 2013-04-27 LAB — COMPREHENSIVE METABOLIC PANEL
Albumin: 3.9 g/dL (ref 3.4–5.0)
Anion Gap: 10 (ref 7–16)
BUN: 3 mg/dL — ABNORMAL LOW (ref 7–18)
Co2: 18 mmol/L — ABNORMAL LOW (ref 21–32)
EGFR (Non-African Amer.): >60
Glucose: 90 mg/dL (ref 65–99)
Osmolality: 275 (ref 275–301)
Potassium: 3.6 mmol/L (ref 3.5–5.1)
SGOT(AST): 27 U/L (ref 15–37)
SGPT (ALT): 26 U/L (ref 12–78)
Sodium: 140 mmol/L (ref 136–145)
Total Protein: 7.7 g/dL (ref 6.4–8.2)

## 2013-04-27 LAB — DRUG SCREEN, URINE
Barbiturates, Ur Screen: NEGATIVE (ref ?–200)
Methadone, Ur Screen: NEGATIVE (ref ?–300)
Opiate, Ur Screen: NEGATIVE (ref ?–300)
Tricyclic, Ur Screen: NEGATIVE (ref ?–1000)

## 2013-04-27 LAB — URINALYSIS, COMPLETE
Bacteria: NONE SEEN
Bilirubin,UR: NEGATIVE
Blood: NEGATIVE
Glucose,UR: NEGATIVE mg/dL (ref 0–75)
Ketone: NEGATIVE
Leukocyte Esterase: NEGATIVE
Nitrite: NEGATIVE
Ph: 5 (ref 4.5–8.0)
Protein: 30
Specific Gravity: 1.032 (ref 1.003–1.030)
Squamous Epithelial: NONE SEEN
WBC UR: <1 /HPF (ref 0–5)

## 2013-04-27 LAB — CBC
MCV: 88 fL (ref 80–100)
Platelet: 360 10*3/uL (ref 150–440)
RBC: 4.71 10*6/uL (ref 4.40–5.90)
RDW: 13.6 % (ref 11.5–14.5)
WBC: 9.3 10*3/uL (ref 3.8–10.6)

## 2013-04-27 LAB — ETHANOL: Ethanol: <3 mg/dL

## 2013-05-24 ENCOUNTER — Emergency Department: Payer: Self-pay

## 2013-05-24 LAB — URINALYSIS, COMPLETE
Bacteria: NONE SEEN
Glucose,UR: NEGATIVE mg/dL (ref 0–75)
Nitrite: NEGATIVE
Squamous Epithelial: NONE SEEN

## 2013-05-24 LAB — CBC
HGB: 13.3 g/dL (ref 13.0–18.0)
MCH: 29.3 pg (ref 26.0–34.0)
MCV: 88 fL (ref 80–100)
Platelet: 250 10*3/uL (ref 150–440)
RBC: 4.54 10*6/uL (ref 4.40–5.90)
RDW: 14.1 % (ref 11.5–14.5)
WBC: 7.8 10*3/uL (ref 3.8–10.6)

## 2013-05-24 LAB — COMPREHENSIVE METABOLIC PANEL
Alkaline Phosphatase: 131 U/L (ref 50–136)
Anion Gap: 7 (ref 7–16)
Co2: 19 mmol/L — ABNORMAL LOW (ref 21–32)
Creatinine: 0.73 mg/dL (ref 0.60–1.30)
EGFR (African American): >60
Osmolality: 275 (ref 275–301)
Potassium: 3.4 mmol/L — ABNORMAL LOW (ref 3.5–5.1)
SGOT(AST): 38 U/L — ABNORMAL HIGH (ref 15–37)
SGPT (ALT): 41 U/L (ref 12–78)
Total Protein: 7 g/dL (ref 6.4–8.2)

## 2013-05-24 LAB — DRUG SCREEN, URINE
Barbiturates, Ur Screen: NEGATIVE (ref ?–200)
Benzodiazepine, Ur Scrn: NEGATIVE (ref ?–200)
Cocaine Metabolite,Ur ~~LOC~~: NEGATIVE (ref ?–300)
Opiate, Ur Screen: NEGATIVE (ref ?–300)
Tricyclic, Ur Screen: NEGATIVE (ref ?–1000)

## 2013-05-24 LAB — ETHANOL: Ethanol %: <0.003 % (ref 0.000–0.080)

## 2013-05-24 LAB — ACETAMINOPHEN LEVEL: Acetaminophen: <2 ug/mL

## 2013-05-24 LAB — SALICYLATE LEVEL: Salicylates, Serum: 1.8 mg/dL

## 2013-06-17 ENCOUNTER — Emergency Department: Payer: Self-pay | Admitting: Emergency Medicine

## 2013-06-17 LAB — RAPID INFLUENZA A&B ANTIGENS

## 2013-07-12 ENCOUNTER — Emergency Department: Payer: Self-pay | Admitting: Emergency Medicine

## 2013-07-12 LAB — CBC
HCT: 38.5 % — ABNORMAL LOW (ref 40.0–52.0)
HGB: 13 g/dL (ref 13.0–18.0)
MCH: 28.9 pg (ref 26.0–34.0)
MCHC: 33.7 g/dL (ref 32.0–36.0)
MCV: 86 fL (ref 80–100)
PLATELETS: 226 10*3/uL (ref 150–440)
RBC: 4.49 10*6/uL (ref 4.40–5.90)
RDW: 13.7 % (ref 11.5–14.5)
WBC: 6.6 10*3/uL (ref 3.8–10.6)

## 2013-07-12 LAB — DRUG SCREEN, URINE

## 2013-07-12 LAB — COMPREHENSIVE METABOLIC PANEL
ALK PHOS: 101 U/L
ALT: 49 U/L (ref 12–78)
Albumin: 3.8 g/dL (ref 3.4–5.0)
Anion Gap: 7 (ref 7–16)
BILIRUBIN TOTAL: 0.4 mg/dL (ref 0.2–1.0)
BUN: 10 mg/dL (ref 7–18)
CALCIUM: 8.7 mg/dL (ref 8.5–10.1)
CREATININE: 1 mg/dL (ref 0.60–1.30)
Chloride: 108 mmol/L — ABNORMAL HIGH (ref 98–107)
Co2: 23 mmol/L (ref 21–32)
EGFR (African American): >60
Glucose: 101 mg/dL — ABNORMAL HIGH (ref 65–99)
Osmolality: 275 (ref 275–301)
POTASSIUM: 3.8 mmol/L (ref 3.5–5.1)
SGOT(AST): 33 U/L (ref 15–37)
Sodium: 138 mmol/L (ref 136–145)
TOTAL PROTEIN: 7.3 g/dL (ref 6.4–8.2)

## 2013-07-12 LAB — ACETAMINOPHEN LEVEL

## 2013-07-12 LAB — SALICYLATE LEVEL

## 2013-07-12 LAB — ETHANOL
Ethanol %: <0.003 % (ref 0.000–0.080)
Ethanol: <3 mg/dL

## 2013-08-20 ENCOUNTER — Emergency Department: Payer: Self-pay | Admitting: Emergency Medicine

## 2013-08-20 LAB — DRUG SCREEN, URINE

## 2013-08-20 LAB — URINALYSIS, COMPLETE
BACTERIA: NONE SEEN
BLOOD: NEGATIVE
Bilirubin,UR: NEGATIVE
GLUCOSE, UR: NEGATIVE mg/dL (ref 0–75)
KETONE: NEGATIVE
LEUKOCYTE ESTERASE: NEGATIVE
Nitrite: NEGATIVE
PH: 6 (ref 4.5–8.0)
SPECIFIC GRAVITY: 1.025 (ref 1.003–1.030)
Squamous Epithelial: NONE SEEN
WBC UR: 1 /HPF (ref 0–5)

## 2013-08-20 LAB — COMPREHENSIVE METABOLIC PANEL
ALBUMIN: 3.8 g/dL (ref 3.4–5.0)
ALK PHOS: 106 U/L
ALT: 42 U/L (ref 12–78)
ANION GAP: 7 (ref 7–16)
BUN: 13 mg/dL (ref 7–18)
Bilirubin,Total: 0.4 mg/dL (ref 0.2–1.0)
CALCIUM: 9.2 mg/dL (ref 8.5–10.1)
CO2: 24 mmol/L (ref 21–32)
Chloride: 108 mmol/L — ABNORMAL HIGH (ref 98–107)
Creatinine: 1 mg/dL (ref 0.60–1.30)
EGFR (Non-African Amer.): >60
Glucose: 115 mg/dL — ABNORMAL HIGH (ref 65–99)
Osmolality: 279 (ref 275–301)
Potassium: 3.8 mmol/L (ref 3.5–5.1)
SGOT(AST): 124 U/L — ABNORMAL HIGH (ref 15–37)
SODIUM: 139 mmol/L (ref 136–145)
TOTAL PROTEIN: 8.1 g/dL (ref 6.4–8.2)

## 2013-08-20 LAB — CBC
HCT: 43.5 % (ref 40.0–52.0)
HGB: 14 g/dL (ref 13.0–18.0)
MCH: 28.6 pg (ref 26.0–34.0)
MCHC: 32.3 g/dL (ref 32.0–36.0)
MCV: 89 fL (ref 80–100)
Platelet: 225 10*3/uL (ref 150–440)
RBC: 4.91 10*6/uL (ref 4.40–5.90)
RDW: 14.4 % (ref 11.5–14.5)
WBC: 8.9 10*3/uL (ref 3.8–10.6)

## 2013-08-20 LAB — ETHANOL: Ethanol %: <0.003 % (ref 0.000–0.080)

## 2013-08-20 LAB — SALICYLATE LEVEL

## 2013-08-20 LAB — ACETAMINOPHEN LEVEL: Acetaminophen: <2 ug/mL

## 2014-05-14 ENCOUNTER — Emergency Department: Payer: Self-pay | Admitting: Emergency Medicine

## 2014-05-14 LAB — COMPREHENSIVE METABOLIC PANEL
Albumin: 3.9 g/dL (ref 3.4–5.0)
Alkaline Phosphatase: 92 U/L
Anion Gap: 8 (ref 7–16)
BUN: 11 mg/dL (ref 7–18)
Bilirubin,Total: 0.2 mg/dL (ref 0.2–1.0)
CREATININE: 1.3 mg/dL (ref 0.60–1.30)
Calcium, Total: 8.4 mg/dL — ABNORMAL LOW (ref 8.5–10.1)
Chloride: 104 mmol/L (ref 98–107)
Co2: 25 mmol/L (ref 21–32)
EGFR (Non-African Amer.): >60
Glucose: 88 mg/dL (ref 65–99)
Osmolality: 273 (ref 275–301)
Potassium: 3.8 mmol/L (ref 3.5–5.1)
SGOT(AST): 37 U/L (ref 15–37)
SGPT (ALT): 37 U/L
Sodium: 137 mmol/L (ref 136–145)
Total Protein: 7.2 g/dL (ref 6.4–8.2)

## 2014-05-14 LAB — CBC
HCT: 44.7 % (ref 40.0–52.0)
HGB: 14.8 g/dL (ref 13.0–18.0)
MCH: 30.5 pg (ref 26.0–34.0)
MCHC: 33.2 g/dL (ref 32.0–36.0)
MCV: 92 fL (ref 80–100)
Platelet: 223 10*3/uL (ref 150–440)
RBC: 4.87 10*6/uL (ref 4.40–5.90)
RDW: 13.2 % (ref 11.5–14.5)
WBC: 8.2 10*3/uL (ref 3.8–10.6)

## 2014-05-14 LAB — ACETAMINOPHEN LEVEL: Acetaminophen: <2 ug/mL

## 2014-05-14 LAB — ETHANOL: Ethanol: <3 mg/dL

## 2014-05-14 LAB — SALICYLATE LEVEL: Salicylates, Serum: <1.7 mg/dL

## 2014-05-15 LAB — URINALYSIS, COMPLETE
BLOOD: NEGATIVE
Bilirubin,UR: NEGATIVE
Glucose,UR: NEGATIVE mg/dL (ref 0–75)
Ketone: NEGATIVE
Leukocyte Esterase: NEGATIVE
Nitrite: NEGATIVE
PH: 7 (ref 4.5–8.0)
Protein: NEGATIVE
SPECIFIC GRAVITY: 1.003 (ref 1.003–1.030)
Squamous Epithelial: NONE SEEN

## 2014-05-15 LAB — DRUG SCREEN, URINE
Amphetamines, Ur Screen: NEGATIVE (ref ?–1000)
BARBITURATES, UR SCREEN: NEGATIVE (ref ?–200)
Benzodiazepine, Ur Scrn: POSITIVE (ref ?–200)
Cannabinoid 50 Ng, Ur ~~LOC~~: NEGATIVE (ref ?–50)
Cocaine Metabolite,Ur ~~LOC~~: NEGATIVE (ref ?–300)
MDMA (ECSTASY) UR SCREEN: NEGATIVE (ref ?–500)
Methadone, Ur Screen: NEGATIVE (ref ?–300)
OPIATE, UR SCREEN: NEGATIVE (ref ?–300)
Phencyclidine (PCP) Ur S: NEGATIVE (ref ?–25)
TRICYCLIC, UR SCREEN: NEGATIVE (ref ?–1000)

## 2014-05-22 ENCOUNTER — Emergency Department: Payer: Self-pay | Admitting: Emergency Medicine

## 2014-10-23 NOTE — Consult Note (Signed)
Details:    - Psychiatry: Patient seen. He denies feeling paranoid and minimizes his hallucinations. Shows no insight about his illness. Gets irritable and tangential very easily. Tried to do some education and supportive counceling. Patient refuses prolixin decanoate shots but did agree to take oral antipsychotics even though he doubts their effectiveness. Will order prolixin 5mg  bid.   Electronic Signatures: Audery Amellapacs, Stpehen Petitjean T (MD)  (Signed 19-Jul-13 17:58)  Authored: Details   Last Updated: 19-Jul-13 17:58 by Audery Amellapacs, Americus Perkey T (MD)

## 2014-10-23 NOTE — H&P (Signed)
PATIENT NAME:  Dylan [name], Dylan [name] MR#:  [id number] DATE OF BIRTH:  [birth year]  DATE OF ADMISSION:  01/20/2012  PRIMARY CARE PHYSICIAN: Does not have one.   CHIEF COMPLAINT: Altered mental status.   HISTORY OF PRESENT ILLNESS: This is [name] 39 year old male brought in by EMS as he was found collapsed near his group home. The patient currently is nonverbal and very lethargic, therefore most of the history is obtained from the ER physician and from the chart. The patient apparently has [name] significant psychiatric history with [name] history of abusing over-the-counter cough medicines. The patient also has been admitted to behavioral medicine for history of schizophrenia, schizoaffective disorder, and bipolar disorder. He apparently is very noncompliant with his antipsychotics. Today he was found collapsed outside his group home. He was brought to the ER and noted to have [name] temperature of 107. He was also noted to be in severe rhabdomyolysis, and his urine is tea-colored. Given his acute renal failure and severe rhabdomyolysis, the hospitalist service was contacted for further treatment and evaluation.   REVIEW OF SYSTEMS: Currently unobtainable given the patient's mental status.   PAST MEDICAL HISTORY:  1. Schizoaffective disorder.  2. Bipolar disorder.  3. Substance abuse.   ALLERGIES:  Antihistamines, Geodon, and Risperdal.   SOCIAL HISTORY: Unobtainable given the patient's mental status.   FAMILY HISTORY: Unobtainable given the patient's mental status.   CURRENT MEDICATIONS: As per his previous discharge summary:  1. Fluphenazine 5 mg b.i.d.  2. Fluphenazine decanoate injection 25 mg monthly.  3. Benztropine 1 mg daily.  But as per the ER physician he is noncompliant with them.   PHYSICAL EXAMINATION: VITAL SIGNS: Temperature 102.5 rectally, pulse 134, respirations 24, blood pressure 135/75, sats 99% on 45% Ventimask.   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    HEENT: He is atraumatic, normocephalic. His extraocular muscles are intact. His pupils are equal and reactive to light. Sclerae anicteric. No conjunctival injection. No pharyngeal erythema.   NECK: Supple. No jugular venous distention, no bruits, no lymphadenopathy, no thyromegaly. He does have bruising around his left eye and left side of the forehead from his fall. No lacerations. No drainage noted.    HEART: Regular rate and rhythm, tachycardic. No murmurs, no rubs, no clicks.   LUNGS: Clear to auscultation bilaterally. No rales, no rhonchi, no wheezes.   ABDOMEN: Soft, flat, nontender, and nondistended. Has good bowel sounds. No hepatosplenomegaly appreciated.   EXTREMITIES: There is no evidence of any cyanosis, clubbing, or peripheral edema. Has +2 pedal and radial pulses bilaterally.   NEUROLOGIC: He is alert, awake, and oriented times zero. Difficult to do [name] full neurological exam. The patient is not responsive to any painful or verbal stimuli or sternal rub presently. His Babinski's are downgoing bilaterally.   SKIN: Moist and warm. He does have [name] small abrasion on his left foot which does not appear to be infected.   LYMPHATIC: There is no cervical or axillary lymphadenopathy. The patient also has two tattoos, one on his forehead and one on the left side of his chest.   LABORATORY AND RADIOLOGICAL DATA:  Serum glucose 107, BUN 30, creatinine 3.8, sodium 146, potassium 3.7, chloride 115, bicarbonate 16. LFTs showed AST 966, ALT 185, albumin of 3.2, troponin 0.4, CK-MB of 13.7, TSH of 0.9.. Urine tox is negative. White cell count 7.5, hemoglobin 12.8, hematocrit 39.6, platelet count 159, prothrombin time 13.6, INR 1. His urinalysis showed [name] specific gravity 1.02, blood 3 +,  100 milligrams of protein, and 1+ bacteria. The patient did have [name] CT of the head done which showed no evidence of any acute intracranial abnormality.   ASSESSMENT AND PLAN: This is [name] 39 year old male with history  of schizoaffective disorder and bipolar disorder who abuses over-the-counter antitussives and cough suppressants and presented to the hospital with altered mental status and being found down. He was noted to be severely hyperthermic and in acute rhabdomyolysis.  1. Hyperthermia. This was likely environmental in nature as he was found collapsed outside his group home. There is some concern of malignant hyperthermia but the patient is not compliant with his antipsychotics. Temperature has come down with cooling blanket and some cool IV fluids and for now we will continue that. We will follow his fever curve and give him Tylenol per rectum for now.  2. Acute rhabdomyolysis. I think this is also secondary to the hyperthermia and also him being found down and related to heat exhaustion.  We will give him aggressive IV fluids, give him bicarbonate and also normal saline, follow his urine output, follow his CKs.  3. Acute renal failure. This is likely secondary to the acute rhabdomyolysis. We will give him aggressive IV fluids including D5W with bicarbonate. We will renal dose his medications, avoid nephrotoxins. I will get [name] nephrology consult. I discussed the case with Dr. Thedore Mins who agrees with this management.  4. Abnormal liver function tests. I think this is likely secondary to rhabdomyolysis. We will follow his LFTs once his rhabdomyolysis improves.  5. History of schizophrenia and bipolar disorder. We will get [name] psychiatric consult. We will use some p.r.n. benzodiazepines for agitation.   CODE STATUS: The patient is [name] FULL CODE.  TIME SPENT ON ADMISSION: 50 minutes.   ____________________________ Rolly Pancake. Cherlynn Kaiser, MD vjs:bjt D: 01/20/2012 19:22:00 ET T: 01/21/2012 06:46:26 ET JOB#: [id number]  cc: Rolly Pancake. Cherlynn Kaiser, MD, <Dictator> Houston Siren MD ELECTRONICALLY SIGNED 01/21/2012 15:35

## 2014-10-23 NOTE — Consult Note (Signed)
Brief Consult Note: Diagnosis: schizophrenia.   Patient was seen by consultant.   Consult note dictated.   Recommend further assessment or treatment.   Orders entered.   Comments: Psychiatry: Patient seen. Known long history of schizophrenia and poor judgement and labile bizarre behavior. Here in CCU for, as I understand it, likely heat stroke. Medically recovering but still with v abnormal labs, foley, ivf. Pt has been petitioned for IVC. I suggest a sitter for now to help make sure his agitation doesn't lead to him endangering himself. Once he is medically stable [facility] Will not start antipsychotic now because he will refuse it and the arguement will agitate him. In case anyone was worried, there is no chance this is neuroleptic malignant syndrome as he has not been taking any antipsychotic in many months or year.  Electronic Signatures: Audery Amellapacs, Heran Campau T (MD)  (Signed [phone]-Jul-13 10:22)  Authored: Brief Consult Note   Last Updated: 18-Jul-13 10:22 by Audery Amellapacs, Redina Zeller T (MD)
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
## 2014-10-23 NOTE — Consult Note (Signed)
PATIENT NAME:  Dylan Dylan [name], Dylan Dylan [name] MR#:  [id number] DATE OF BIRTH:  09-22-1975  DATE OF CONSULTATION:  01/22/2012  REFERRING PHYSICIAN:  Katharina Caperima Vaickute, MD CONSULTING PHYSICIAN:  Dylan Klimowicz R. Sherrlyn HockPandit, MD  REASON FOR CONSULTATION: Renal failure, fever, and thrombocytopenia, rule out HUS.   HISTORY OF PRESENT ILLNESS: The patient is Dylan [name] 39 year old gentleman with past medical history significant for schizoaffective disorder, bipolar disorder and substance abuse who has been admitted to the hospital on 01/20/2012 with altered mental status. The patient was reportedly brought in by EMS and he was found collapsed near his group home. He was noted to have severe hypothermia with temperature of 107, and diagnosed to have rhabdomyolysis and acute renal failure. Nephrology is following. The patient was also found to have progressive thrombocytopenia. CBC on admission showed normal platelet count of 159,000 with hemoglobin 12.8 and WBC 11,500. CBC on 01/21/2012 showed platelet count was down to 82 with hemoglobin 11.8 and WBC 9100, and repeat CBC done today showed platelet count even lower at 62,000 with hemoglobin of 10.8, MCV 90, and WBC 9600 with ANC of 7200 and 6,000 bands.   The patient clinically states that he feels much better as compared to prior to admission, he is alert and oriented. He has not had recurrent fevers overnight. Serum LDH done yesterday was 2384, total CK was 59,866 but improving. The patient denies any known prior history of thrombocytopenia. Denies any obvious bleeding symptoms.   PAST MEDICAL/SURGICAL HISTORY:  1. Schizoaffective disorder.  2. Substance abuse.  3. Bipolar disorder.   FAMILY HISTORY: Denies any hematological disorders.   SOCIAL HISTORY: Has history of substance abuse. Denies smoking or alcohol abuse.   MEDICATIONS PRIOR TO ADMISSION:  1. Fluphenazine 5 mg twice Dylan [name] day. 2. Benztropine 1 mg daily.  3. Fluphenazine decanoate injection 25 mg monthly.   MEDICATIONS CURRENTLY  IN HOSPITAL:  1. Calcium supplement 1 tablet twice Dylan [name] day. 2. Fluphenazine 5 mg p.o. twice Dylan [name] day. 3. Lasix 120 mg IV every six hours. 4. Tylenol suppository 650 mg every four hours p.r.n. for pain or fever.  5. Ativan 1 to 2 mg IV every 2 to 4 hours p.r.n. for agitation.  6. Zofran 4 mg IV every four hours p.r.n. for nausea and vomiting.   REVIEW OF SYSTEMS: CONSTITUTIONAL: Has generalized weakness but overall feels better. Currently no fever or chills. HEENT: Denies any headaches or dizziness at rest. No epistaxis, ear or jaw pain. CARDIAC: Denies any angina, palpitations, orthopnea, or paroxysmal nocturnal dyspnea. LUNGS: No new dyspnea at rest. No new cough, sputum, or hemoptysis. GASTROINTESTINAL: Denies any nausea or vomiting. No diarrhea or blood in stools. GENITOURINARY: No dysuria or hematuria. Urine output is less. SKIN: Denies any new rashes or pruritus. HEMATOLOGIC: Denies bleeding symptoms. MUSCULOSKELETAL: No new bone pains. NEUROLOGIC: As in history of present illness. Denies any focal weakness, seizures, or paresthesias in extremities. EXTREMITIES: Denies any new swelling or pain. ENDOCRINE: No polyuria or polydipsia. Appetite fairly steady.   PHYSICAL EXAMINATION:   GENERAL: The patient is Dylan [name] moderately built and well nourished individual resting in bed. Alert and oriented to self, place, person, and time and converses appropriately. No acute distress.   VITAL SIGNS: 98.1, 108, 18, 137/91, and 90% on 3 liters oxygen.   HEENT: Normocephalic, atraumatic. Extraocular movements intact. Sclera anicteric. No oral thrush or petechiae.   NECK: Negative for lymphadenopathy.   CARDIOVASCULAR: S1 and S2, regular rate and rhythm.   PULMONARY: Lungs show bilateral good air entry, no  rhonchi.   ABDOMEN: Soft, nontender, no hepatosplenomegaly clinically.   LYMPHATICS: No adenopathy in the axillary or inguinal areas.   SKIN: No generalized rashes, major bruising or petechiae.    NEUROLOGIC: Cranial nerves seem intact. Moves all extremities spontaneously.   MUSCULOSKELETAL: No obvious joint deformity or swelling.   LABORATORY AND RADIOLOGIC DATA: As in history of present illness above. In addition, creatinine today is 6.74, BUN 37, potassium 3.0, sodium 138, and calcium 6.4. LDH is 2384. Total CK is 59,866. WBC 9600 with 75% neutrophils, 6% bands, 20% lymphocytes, platelet count 62,000, and hemoglobin 10.8 with MCV of 90. Troponin I 0.32.   2-D echocardiogram showed Dylan [name] LVF of greater than 55%. LV systolic function normal   Blood culture negative since admission.   CT of the head without contrast on 01/20/2012 negative for acute intracranial process.   Serum ethanol on admission was less than 3.   Urine culture negative   IMPRESSION AND RECOMMENDATIONS: This is Dylan [name] 39 year old gentleman with history of schizoaffective disorder, bipolar disorder, and history of dextromethorphan abuse who has been admitted to the hospital on 01/20/2012 with AMS, severe hyperthermia and found to have rhabdomyolysis and acute renal failure. The patient also has progressive mild thrombocytopenia with no obvious bleeding symptoms at this time. No known history of thrombocytopenia, chronic liver disease, or splenomegaly in the past. Per discussion with Dr. Oneita Kras, peripheral smear does not show schistocytes, altered mental status seems to be improving well and currently afebrile. Continues to have issues with acute renal failure, nephrology is following, still has urine output. Above findings makes HUS/TTP less likely. More likely possibility could be disseminated intravascular coagulation secondary to rhabdomyolysis versus other etiology including history of substance abuse. No hepatosplenomegaly on exam. Agree with getting further work-up to rule out other etiologies including checking hepatitis panel, HIV, heparin-induced platelet aggregation to rule out HIT, haptoglobin, B12 and folate, manual  differential, iron and TIBC, PT and PTT. Continue current supportive treatment and monitor CBC/platelet count daily. Will continue to follow.   Thank you for the referral. Please feel free to contact me for additional questions.  ____________________________ Maren Reamer Sherrlyn Hock, MD srp:slb D: 01/23/2012 00:42:00 ET T: 01/23/2012 08:11:10 ET JOB#: [id number]  cc: Azalea Cedar R. Sherrlyn Hock, MD, <Dictator> Wille Celeste MD ELECTRONICALLY SIGNED 01/23/2012 23:08

## 2014-10-23 NOTE — Consult Note (Signed)
HEMATOLOGY followup - patient is awake, states that he continues to feel better overall.  Denies bleeding symptoms. no fever or chills overnight. Denies diarrhea or abdominal painalert and oriented, NAD          vitals - afebrile, stable          lungs - b/l good air entry          abd - soft, NT          extremities - no major bruising or petechiaeWBC 7300, Hb 11.1, platelets 106K, ANC 5800, Cr 6.53. On 7/20, Bili 0.8, ALT 196, AST 436, alk phos normal. Rapid HIV, haptoglobin, B12, folate, iron and TIBC, PT and PTT are unremarkable. Hepatitis panel, heparin-induced platelet aggregation pending.  Impression/Recommendations:gentleman with history of schizoaffective disorder, bipolar disorder, and history of dextromethorphan abuse who has been admitted to the hospital on 01/20/2012 with AMS, severe hyperthermia and found to have rhabdomyolysis, acute renal failure and thrombocytopenia (likely related to ongoing rhabdomyolysis versus drug-induced versus other etiology). No obvious bleeding symptoms at this time. Altered mental status seems to have improved and is currently afebrile.  Creatinine is better today, nephrology is following. Platelet count is better today at 106K.  No new recommendations, continue to monitor CBC/platelet count daily. Will continue to follow.  Electronic Signatures: Jonn Shingles (MD)  (Signed on 21-Jul-13 08:05)  Authored  Last Updated: 21-Jul-13 08:05 by Jonn Shingles (MD)

## 2014-10-23 NOTE — Consult Note (Signed)
HEMATOLOGY followup note - feels better, denies new complaints. No bleeding issues. Low grade fever upto 100.3. Denies diarrheaNAD          vitals - Tmax 100.3, stable          lungs - b/l good air entry          abd - soft, NTWBC 7400, Hb 11.1, platelets 85K, ANC 5700, Cr 7.55, Bili 0.8, ALT 196, AST 436, alk phos normal. Rapid HIV, haptoglobin, B12, folate, iron and TIBC, PT and PTT are unremarkable. Hepatitis panel, heparin-induced platelet aggregation pending.  Impression/Recommendations:gentleman with history of schizoaffective disorder, bipolar disorder, and history of dextromethorphan abuse who has been admitted to the hospital on 01/20/2012 with AMS, severe hyperthermia and found to have rhabdomyolysis, acute renal failure and thrombocytopenia likely related to ongoing rhabdomyolysis versus drug-induced versus other etiology. No obvious bleeding symptoms at this time. Per discussion with Dr. Rubinas, peripheral smear does not show schistocytes, altered mental status seems to be improving well and currently afebrile. Continues to have issues with acute renal failure, nephrology is following. Platelet count is better today. Continue to monitor daily CBC/platelet count daily. Will continue to follow.      Electronic Signatures: ,  Raj (MD)  (Signed on 20-Jul-13 23:21)  Authored  Last Updated: 20-Jul-13 23:21 by ,  Raj (MD)  

## 2014-10-23 NOTE — Consult Note (Signed)
Details:    - Psychiatry: Patient seen. Awake, alert and oriented. He has been complient with his medical treatm,ent in the hospital and not displaying agitated behavior. I am aware that at times the things he talks about are idiosyncratic, such as believing LSD treats schizophrenia or that he belongs to a unique religion and that is the reason for his history of odd behavior, but basically he can articulate a rational plan for his living and is stating he will go to outpatient [facility]. He does have a known history of noncomplience with treatment but currently there are no grounds for forcing decanoate treatment on him. Supportive therapy and education done. I do not think he needs inpatient psychiatric treatment now and do not think he needs [facility]. I advise he be discharged home when he is medically stable and that he be given a written reminder of his outpatient appointment [facility] as part of his discharge planning. I also advise that the current dose of prolixin be continued at discharge.   Electronic Signatures: Audery Amellapacs, John T (MD)  (Signed 23-Jul-13 11:34)  Authored: Details   Last Updated: 23-Jul-13 11:34 by Audery Amellapacs, John T (MD)

## 2014-10-23 NOTE — Consult Note (Signed)
Details:    - Psychiatry: Patient seen. He has no new complaint. He was awake and alert and appropriately interactive with me. Did not make any bizarre or delusional comment. Affect euthymic and appropriate. Cooperative with treatment.  He has been psychiatricly stable without behavior problems this stay. I think he does not need the one on one sitter and will dc the order. It can always be restarted if needed. Will continue medication and continue to follow.   Electronic Signatures: Audery Amellapacs, John T (MD)  (Signed 21-Jul-13 13:23)  Authored: Details   Last Updated: 21-Jul-13 13:23 by Audery Amellapacs, John T (MD)

## 2014-10-23 NOTE — Consult Note (Signed)
HEMATOLOGY followup note -  states that he continues to feel better.  Denies bleeding symptoms. no fevers. Denies diarrhea or abdominal painpatient is alert and oriented, NAD          vitals - afebrile, stable          lungs - b/l good air entry          abd - soft, NT          extremities - no major bruising or petechiae WBC 7000, Hb 10.8, platelets 123K, ANC 5300, Cr 4.93.  Rapid HIV, haptoglobin, B12, folate, iron and TIBC, PT and PTT are unremarkable. HBsAg and HCv Ab negative. gentleman with history of schizoaffective disorder, bipolar disorder, and history of dextromethorphan abuse who has been admitted to the hospital on 01/20/2012 with AMS, severe hyperthermia and found to have rhabdomyolysis, acute renal failure and thrombocytopenia (likely related to ongoing rhabdomyolysis versus drug-induced versus other etiology). No obvious bleeding symptoms at this time. Altered mental status and fever have improved, Creatinine is improving. Platelet count is significantly better today at 123K.  No new recommendations, continue to monitor CBC/platelet count daily while in hopsital. Will otherwise sign off the case, please re-consult if needed. Patient explained above.   Electronic Signatures: Izola PricePandit, Alexandro Line Raj (MD)  (Signed on 22-Jul-13 23:56)  Authored  Last Updated: 22-Jul-13 23:56 by Izola PricePandit, Fynn Adel Raj (MD)

## 2014-10-23 NOTE — Consult Note (Signed)
PATIENT NAME:  Dylan [name], [name] MR#:  [id number] DATE OF BIRTH:  Nov 01, 1975  DATE OF CONSULTATION:  01/21/2012  REFERRING PHYSICIAN:   CONSULTING PHYSICIAN:  Audery Amel, MD  IDENTIFYING INFORMATION AND REASON FOR CONSULT: The patient is a 39 year old man interviewed while he was in the Critical Care Unit. Admitted for rhabdomyolysis. Consult because of his agitation, resistance to treatment, requests for discharge.   HISTORY OF PRESENT ILLNESS: History obtained from the patient and from the chart. This 39 year old man with a long history of schizophrenia was evidently found by passersby or neighbors passed out outdoors in front of his boarding house. EMS was called and found him to be unresponsive and hyperthermic. He was brought to the hospital where it is recorded he had temperature that peaked at 107. He is in acute renal failure with rhabdomyolysis. Most likely cause is heat stroke. The patient states to me that he does not remember anything about being picked up by EMS or brought to the hospital. The last thing he remembers is what he says was just voluntarily laying down outside. He claims that he does not remember having felt sick. He has not been on any psychiatric medicine recently. The patient is disorganized and psychotic in his thinking. His affect is irritable. He denies being depressed and totally denies any suicidal ideation or homicidal ideation. He is not a good historian and tends to focus on tangential issues. He did not request in the time I was talking with him to be immediately discharged. He did indicate that he wanted to go home, but at the same time indicated that he really wished that he were in [place] at [facility] because they have air conditioning there.   PAST PSYCHIATRIC HISTORY: The patient has a long and established history of schizophrenia with multiple psychiatric admissions including admissions to our hospital and to [facility] in the past.  The patient has a history of noncompliance and resistance to medication treatment. When he is not on treatment he is often disorganized, agitated, and bizarre in his behavior. When he is on medication, especially on Prolixin decanoate, he has better control of his behavior and thinking. The patient denies any history of suicide attempts in the past. He does have a history of some aggression but not predatory violence. He has a history of abuse of dextromethorphan from cold medications. When he is abusing dextromethorphan he becomes particularly agitated and psychotic.   SOCIAL HISTORY: The patient is single. He currently does not have a stable place to live. He was recently in prison for an extended stretch of months on a charge of assaulting a Emergency planning/management officer. The full story is told in previous History and Physicals. Suffice it to say that it all revolved around his psychotic behavior. Evidently he was not getting any medication while he was locked up and was discharged without a clear-cut follow-up psychiatric plan, and has no current psychiatric provider. In the past he has been followed by the ACT team at times. The patient is not married. He has no immediate involvement with his family, although his mother and siblings have been supportive at times in the past. He has gotten [employer] and Medicaid in the past, although it has been discontinued since he has been locked up and needs to be re-established.   PAST MEDICAL HISTORY: Other than the acute rhabdomyolysis he has no significant ongoing medical problems.   MEDICATIONS: None currently.   ALLERGIES: No known drug allergies.  SUBSTANCE ABUSE HISTORY: History primarily of abuse of dextromethorphan. He does not routinely appear to abuse many other drugs, although may have used other drugs or alcohol at times.   REVIEW OF SYSTEMS: The patient is complaining of some aches and pains. Mild fatigue. He tends to minimize his physical complaints. He  denies that he is having auditory or visual hallucinations. Denies feeling hostile or agitated. Denies suicidal ideation.   MENTAL STATUS EXAM:  The patient has an odd personal appearance due to tattoos on his face and bizarre hairstyle, interviewed in the Critical Care Unit. He was alert and oriented and awake and cooperative with the interview. Made good eye contact. Psychomotor activity slightly fidgety and agitated. Voice was normal in rate and tone. Mood was stated as being annoyed. Affect was slightly irritable and labile but not hostile or aggressive. At other times he was noticed to have fallen asleep, although not during the interview with me. His thoughts were tangential, somewhat disorganized, focused on paranoia and discussing his grievances with the police, but not floridly bizarre. Denies hallucinations. Denies suicidal or homicidal ideation currently. Chronically poor judgment and insight regarding his mental health. Intelligence average.   ASSESSMENT: This is a 39 year old man with a history of schizophrenia, undifferentiated type, who probably is suffering from heat stroke from having passed out outdoors in front of his home. Currently he is denying that he has been abusing any cough syrup or other drugs. He is not on any psychiatric medicine. He has poor insight and a history of poor judgment that tends to endanger himself. The patient would probably benefit greatly from psychiatric hospitalization to try to re-establish medication treatment and an organized outpatient plan. Without that, in his current condition it is likely that he will continue to get in trouble because of his poor judgment.   TREATMENT PLAN: I did not suggest restarting antipsychotics at this point because he already told me that he would refuse them and I did not want to have a discussion that would get him more agitated. He should be given p.r.n. benzodiazepines if necessary to keep him calm while he recovers from his  rhabdomyolysis. He is already under involuntary commitment papers. Once he is medically ready for discharge from medicine, it would be reasonable to admit him to the psychiatry service. I will follow him on the medical service until then.   DIAGNOSIS PRINCIPLE AND PRIMARY:  AXIS I: Schizophrenia, undifferentiated.   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   AXIS II: Deferred.   AXIS III: Acute rhabdomyolysis, probably heat stroke.   AXIS IV: Severe stress from recent imprisonment with loss of his benefits and social situation, unsafe current living situation.   AXIS V: Functioning at time of evaluation: 35.  TIME SPENT: 65 minutes.  ____________________________ Audery AmelJohn T. Tanekia Ryans, MD jtc:bjt D: 01/21/2012 22:48:35 ET T: 01/22/2012 10:51:06 ET JOB#: [id number]  cc: Audery AmelJohn T. Grove Defina, MD, <Dictator> Audery AmelJOHN T Dylan Degante MD ELECTRONICALLY SIGNED 01/22/2012 18:22

## 2014-10-23 NOTE — Consult Note (Signed)
Brief Consult Note: Diagnosis: Schizoaffective disorder.   Patient was seen by consultant.   Consult note dictated.   Recommend further assessment or treatment.   Orders entered.   Discussed with Attending MD.   Comments: Mr. Dylan [name] is well known to us. He has [facility], [facility]. He has a h/o treatment noncompliance. He has [facility]. He was brought to the ER by the police after altercation with his roommates while under the influence of DXM. There are possibly legal charges pending. He was initially agitated in the ER. He is cool and collected now. He is not suicidal or homicidal. He is at his baseline.  PLAN: 1. The patient no longer meets criteria for IVC, I will terminate proceedings. Please discharge as appropriate.   2. No medications recommended.   3. He will [facility] 10/3 at 4:00 pm.  Electronic Signatures: Dylan [name], Dylan [name] (MD)  (Signed 18-Sep-13 12:03)  Authored: Brief Consult Note   Last Updated: 18-Sep-13 12:03 by Dylan [name], Dylan [name] (MD)

## 2014-10-23 NOTE — Consult Note (Signed)
Details:    - Psychiatry: Patient seen. He is still recovering from his rhabdo. He has no psychiatric complaints. Says his mood is fine and denies any paranoia or hallucinations. Affect slightly irritable but not hostile. Tolerated the oral prolixin. For now I would continue the sitter due to his past behavior history, but if he remains free of symptoms he may not need [facility] once he is recovered. Supportive counceling done. Will follow   Electronic Signatures: Audery Amellapacs, John T (MD)  (Signed 20-Jul-13 23:55)  Authored: Details   Last Updated: 20-Jul-13 23:55 by Audery Amellapacs, John T (MD)

## 2014-10-23 NOTE — Discharge Summary (Signed)
PATIENT NAME:  Dylan [name], Dylan [name] MR#:  [id number] DATE OF BIRTH:  14-Nov-1975  DATE OF ADMISSION:  01/20/2012 DATE OF DISCHARGE:  01/28/2012  ADMITTING PHYSICIAN: Dr. Hilda LiasVivek Sainani   DISCHARGING PHYSICIAN: Dr. Enid Baasadhika Pamlea Finder   PRIMARY MD: None.   CONSULTATIONS IN THE HOSPITAL:  1. Psychiatric consultation by Dr. Toni Amendlapacs   2. Nephrology consultation by Dr. Mosetta PigeonHarmeet Singh   3. Hematology consultation by Dr. Sherrlyn HockPandit   DISCHARGE DIAGNOSES:  1. Acute renal failure.   2. Rhabdomyolysis.  3. Elevated troponin from poor renal clearance. 4. Schizoaffective disorder.   5. Cough medicine dextromethorphan abuse.  6. Thrombocytopenia secondary to renal failure while in the hospital, resolved.   DISCHARGE HOME MEDICATIONS: Fluphenazine 5 mg p.o. b.i.d.   DISCHARGE DIET: Regular diet.   DISCHARGE ACTIVITY: As tolerated.    FOLLOW-UP INSTRUCTIONS:  1. PCP follow-up 1 to 2 weeks.  2. Basic metabolic panel and also CPK level checks in one week.  3. Outpatient Psych follow-up as recommended by Dr. Toni Amendlapacs.  4. Drink plenty of fluids. 5. Smoking cessation advised.   LABS AND IMAGING STUDIES PRIOR TO DISCHARGE: Sodium 140, potassium 3.9, chloride 108, bicarb 24, BUN 18, creatinine 1.8, glucose 92, calcium 8.7. CK prior to discharge is 1646. WBC 5.3, hemoglobin 10.3, hematocrit 31.7, platelet count 210, ALT 126, AST 168, total bilirubin 0.5, alkaline phosphatase 64, total protein 5.6. Uric acid 11.2.   Chest x-ray showing pulmonary edema with cardiomegaly on admission.   HIV antibodies are negative. Hepatitis panel is negative. Serum iron binding 86, iron binding capacity 206, iron saturation 42%.    Renal ultrasound showing no evidence of obstruction, increased echotexture of the cortex of both kidneys consistent with medical renal disease.   BRIEF HOSPITAL COURSE: Mr. Dylan [name] is [name] 39 year old Caucasian male with past medical history significant for schizoaffective disorder and substance abuse who  presented from [name] group home secondary to altered mental status and he was found collapsed close to the group home. He was known to have [name] history of abusing over-the-counter cough medications and also significant psychiatric history. He had [name] temperature of 107 degrees when he was brought in and was also found in severe rhabdomyolysis and also acute renal failure.  1. Acute renal failure with rhabdomyolysis. Not sure if it is heat stroke versus neuroleptic malignant syndrome, however, less likely to be an MS as the patient was not on any antipsychotics at the time. His creatinine on admission was 3.88, increased 7.5 with elevated CK of greater than 94,500. Because he was having urine output he was never started on dialysis and he was given extensive IV fluids and that improved his CK down to 1646 and also creatinine to 1.88 without any intervention prior to discharge. The patient's mental status is completely improved. He was also found to be hypernatremic with some electrolyte disturbances when he came in and also elevated transaminases all related to his renal failure and also rhabdomyolysis. All labs have improved and are in downward trend so will have follow-up basic metabolic panel as an outpatient in one week.  2. History of schizophrenia with bizarre behavior on admission. He was agitated and was petitioned for IVC with his history. However, he has been clinically improving and also psychologically improving. He was followed by Dr. Toni Amendlapacs in the hospital. He was only on Prolixin b.i.d. He is now stable and did not require any Behavioral Medicine Unit admission. He was cleared of commitment by Psychiatry and is being discharged home in stable  medical condition. His.  Course has been otherwise uneventful in the hospital.   DISCHARGE CONDITION: Stable.   DISCHARGE DISPOSITION: Boarding home.   TIME SPENT ON DISCHARGE: 45 minutes.   The patient will follow-up as an outpatient with [facility] and  also [facility] for his psychiatric follow-up.   ____________________________ Enid Baas, MD rk:drc D: 01/28/2012 14:27:25 ET T: 01/29/2012 11:19:21 ET JOB#: [id number]  cc: Enid Baas, MD, <Dictator> Enid Baas MD ELECTRONICALLY SIGNED 02/01/2012 15:13

## 2014-10-26 NOTE — Consult Note (Signed)
PATIENT NAME:  Dylan [name] MR#:  [id number] DATE OF BIRTH:  03-27-1976  DATE OF CONSULTATION:  11/06/2012  ER REFERRING PHYSICIAN:  Lowella FairyJohn Woodruff, MD   CONSULTING PHYSICIAN:  Jerelyn Trimarco [name]. Allena KatzPatel, MD PRIMARY CARE PHYSICIAN: None.   REASON FOR CONSULTATION: Rhabdomyolysis.  HISTORY OF PRESENT ILLNESS:  Mr. Dylan [name] is [name] 39 year old with gentleman with significant history of multiple psychiatric issues, multiple hospitalizations due to his psychiatry problem. He has symptoms of schizoaffective disorder, comes in after he was [facility] the jail with increasing agitation, restlessness and was brought to the Emergency Room. He has [name] known history of abusing dextromethorphan cough medicine over-the-counter. He was found to have elevated CPK level in the 4000s.  He received several liters of IV fluids. Repeat CPK went up to 5000. Internal medicine was consulted for acute mild rhabdomyolysis.   In the ER, the patient received several liters of fluid since yesterday and got about 8 liters of IV fluids in the form of normal saline lactated Ringer today. He is hemodynamically stable, is urinating well and he is able to take oral fluids as well. The patient is [name] poor historian.  He is not himself and not able to give any review of systems or any history. Most of the history was obtained from the ER physician.   PAST PSYCHIATRIC HISTORY:  1.  He has [facility], [facility], for his schizoaffective disorder.  2.  He has history of  abuse for dextromethorphan.  3.  Medical noncompliance.   FAMILY HISTORY: From old records, grandmother with schizophrenia.   PAST MEDICAL HISTORY:  None.  Again, this is obtained from old records.   ALLERGIES: No known drug allergies from medical records.   MEDICATIONS: Currently in the Emergency Room the patient is on: 1.  Tylenol 650 q. 4 p.r.n.  2.  Ibuprofen 600 q. 6 p.r.n.  3.  Seroquel XR 300 mg at bedtime.   REVIEW OF SYSTEMS: Not  obtainable.   PHYSICAL EXAMINATION: GENERAL: The patient is restless, is in the Emergency Room. He is afebrile, pulse is 110, respirations 20 per minute, blood pressure is 149/95, sats are 96% on room air. Exam is limited secondary to the patient's cooperation. He appears euvolemic.   HEENT: Atraumatic, normocephalic. PERRLA. EOM intact. Oral mucosa is moist.  NECK: Supple.  RESPIRATORY: Clear to auscultation bilaterally. No respiratory distress.  CARDIOVASCULAR: Both the heart sounds are normal. Rate, rhythm regular. PMI not lateralized. Chest nontender.  NEUROLOGICAL:  The patient moves all extremities well. He is ambulating well in the room.  ABDOMEN: Obese, soft, nontender. No organomegaly.  SKIN: Tattoos present on his forehead and arms.  PSYCHIATRIC: The patient is agitated, restless.   LABORATORY DATA:  His last CPK level is 5725.  CPK on admission was 4533. His salicylate is 1.8. CBC within normal limits except [name] white count of 14.5. Serum ethanol is less than 0.003. SGOT is 84. Creatinine is 1.46. Potassium is 3.4. Urinalysis negative for urinary tract infection. Urine drug screen negative. Creatinine is within normal limits. 1.10.   ASSESSMENT: Mr. Dylan [name] is [name] 39 year old with [facility] of [facility] for active ongoing problems with schizoaffective disorder with psychosis, comes into the Emergency Room after he was brought in for:   1.  Altered mental status, agitation and suspected exacerbation of his schizoaffective disorder with psychosis:  The patient has received several doses of Haldol and Benadryl along with IV Ativan for sedation. He  got very agitated and had to be handcuffed and restrained yesterday in the Emergency Room. Currently, the patient is able to walk around. He is much calmer than yesterday. Psychiatric management per Dr. Guss Bunde. 2.  Acute mild rhabdomyolysis: This could be because of the violent agitation and excessive use of his  muscles yesterday when the patient had [name] violent behavior that required him to be restrained. The patient currently is ambulating, is taking oral fluids. He received [name] significant amount of IV fluids in the form of normal saline and lactated Ringer's, about 8 liters. His urine output is good, and his creatinine is stable since admission. We will check 1 more level of CPKs.  If it shows improvement, then we will stop IV fluids and continue encourage oral fluids and continue to monitor his urine output as well.  3.  Elevated blood pressure without diagnosis of hypertension:  Likely due to agitation and psychosis. We will continue to monitor, give p.r.n. blood pressure meds if needed.   Thank you for the consult. We will follow while the patient is here in the Emergency Room.    TIME SPENT: 50 minutes.   ____________________________ Wylie Hail Allena Katz, MD sap:cb D: 11/06/2012 12:03:14 ET T: 11/06/2012 15:15:34 ET JOB#: [id number]  cc: Aisling Emigh [name]. Allena Katz, MD, <Dictator> Willow Ora MD ELECTRONICALLY SIGNED 11/22/2012 15:23

## 2014-10-26 NOTE — Consult Note (Signed)
Brief Consult Note: Diagnosis: schizoaffective disorder.   Patient was seen by consultant.   Consult note dictated.   Recommend further assessment or treatment.   Orders entered.   Discussed with Attending MD.   Comments: Psychiatry: Patient seen. Chart reviewed. Patient denies any suicidal ideation or hostility and is calm and able to articulate a reasonable plan for his own care. He asks for a prescription for haldol and ativan for a month which, given his history, is reasonable. He is to follow up with [facility] for outpt treatment. Discussed with ER MD. IVC discontinued.  Electronic Signatures: Audery Amellapacs, Karsten Howry T (MD)  (Signed 24-Oct-14 14:45)  Authored: Brief Consult Note   Last Updated: 24-Oct-14 14:45 by Audery Amellapacs, Lundynn Cohoon T (MD)

## 2014-10-26 NOTE — Consult Note (Signed)
PCP noneER Dr Margarita Grizzlewoodruff Acute mild rhabdomyolysiswas release from jail , was out on the street in the sun, known to abuse cough meds4000--->5000. t taking orally.about 8 liters of IVFeuvolemicgoodorally fluids wellcheck one more time cpk  severe schizoaffective [facility] bp in the setting of agitationto monitor bpo meds if persistently elevated    Electronic Signatures: Willow OraPatel, Averi Kilty A (MD)  (Signed on 04-May-14 11:51)  Authored  Last Updated: 04-May-14 11:51 by Willow OraPatel, Cristen Bredeson A (MD)

## 2014-10-26 NOTE — Consult Note (Signed)
Brief Consult Note: Diagnosis: Schizoaffective DO, Dextroamphetamine Abuse.   Patient was seen by consultant.   Recommend further assessment or treatment.   Orders entered.   Discussed with Attending MD.   Comments: Pt sen in ED BHU. He continues to have rambling thought process and was talking about his [place] Jetteancestory. Reported that he was kidnapped by the police. He stated that only the robitussin helps him. He continued to show poor insight and has been psychotic/manic during the interview.  Labs reviwed and pt has significantly elevated CK - around 3000.   Called Dr Juliette AlcideMelinda ED Physician and he agreed to do stat labs on him and to order fluids.  He will follow up on him to be medically cleared before discharge.  Will d/c Seroquel.  Start Ativan 0.5mg  TID x 3 days for agitation.  haldol 2 mg po bid x days.  Will continue to monitor.  Electronic Signatures: Rhunette CroftFaheem, Javaughn Opdahl S (MD)  (Signed 06-May-14 15:12)  Authored: Brief Consult Note   Last Updated: 06-May-14 15:12 by Rhunette CroftFaheem, Itzayanna Kaster S (MD)

## 2014-10-26 NOTE — Consult Note (Signed)
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   Patient was seen by consultant.   Recommend further assessment or treatment.   Orders entered.   Discussed with Attending MD.   Comments: Pt seen in ED BHU. He appeared more calm and has been responding to the medications. he is not exhibiting any paranoia, mood or anxiety symptoms. He has been taking his medications as prescribed. He stated that he is sleeping well with Seroquel and he is going to take it as prescribed. No SE of meds noted.He does not have SI/HI or plans. Lives by himself. He will be discharged in stable condition.    Plan; Will d/c IVC.  Continue Seroquel XR 300mg  po qhs  Discharge pt as he is clinically stable.  Electronic Signatures: Rhunette CroftFaheem, Kalleigh [place] S (MD)  (Signed 15-May-14 10:18)  Authored: Brief Consult Note   Last Updated: 15-May-14 10:18 by Rhunette CroftFaheem, [place] S (MD)

## 2014-10-26 NOTE — Consult Note (Signed)
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   Patient was seen by consultant.   Recommend further assessment or treatment.   Orders entered.   Discussed with Attending MD.   Comments: Pt seen in ED BHU. He appeared more calm and has been responding to the medications. he is not exhibiting w/d and has reported that he has DX of DID. He stated that he has 1813 personalities and will talk about it some times later. He does not have SI/HI or plans. Lives by himself.  Responding to medications.   Plan; Will D/C IVC Continue Seroquel XR 300mg  po qhs Lorazepam 0.5mg  #5 pills given at d/c.  Unk PintoHarvey Bryant from [facility] to follow up after D/C.  Electronic Signatures: Rhunette CroftFaheem, Rainbow Salman S (MD)  (Signed 08-May-14 09:59)  Authored: Brief Consult Note   Last Updated: 08-May-14 09:59 by Rhunette CroftFaheem, Wyndell Cardiff S (MD)

## 2014-10-26 NOTE — Consult Note (Signed)
PATIENT NAME:  Dylan [name], Dylan [name] MR#:  [id number] DATE OF BIRTH:  Oct 27, 1975  PSYCHIATRY CONSULTATION   DATE OF ADMISSION: 05/24/2013   DATE OF CONSULTATION:  05/25/2013  REFERRING PHYSICIAN:  Sheryl L. Mindi JunkerGottlieb, MD CONSULTING PHYSICIAN:  Ardeen FillersUzma S. Garnetta BuddyFaheem, MD  REASON FOR CONSULTATION: I am [name] painter, and I had some paint thinner in [name] Pepsi bottle, and I drank it.   HISTORY OF PRESENT ILLNESS: The patient is [name] 39 year old Caucasian male with long history of schizoaffective disorder, who presented again to the ED after the recent discharge in October after he drank some paint thinner. He reported that he called the police after he took some paint thinner. He has [name] long history of schizoaffective disorder and has multiple personalities. He reported that some of the personalities are telling him that he is going to kill himself, and one of them is Arts administratorDragon Manson. The patient reported that he actually keeps the paint thinner in the 7-Up bottle, but he is highly intelligent with [name] mental age of 39. He reported that he was dipping his brush in the 7-Up bottle. However, he reached up for the wrong 7-Up bottle and had [name] big drink. Once he realized what he was doing, he called for help. The patient reported that he does not want to hurt himself. Most of the time, his statements do not make any sense as he is usually bizarre. He reported that he was given Haldol last night to control his behavior. The patient was talking in circles, and it would not make any sense. He was given Haldol and Ativan last night to control his behavior. The patient reported that he was not abusing any drugs, specifically dextromethorphan and paint thinners, as he has [name] long history of inhaling paint thinners. The patient also mentioned that there is [name] cure for schizophrenia and was mentioning about LSD. He reported that when he dissociates, he would start crying, and then he will not be able to control himself. He was also talking about [name] cross  on his forehead as he is planning to get [name] tattoo on his forehead. He already has [name] large tattoo on his forehead and was talking about getting [name] tattoo on his eyelid. He feels that he feels better when he gets multiple tattoos on his body. The patient reported that he does not have any thoughts to harm himself at this time. He reported that his life has been stable at this time.   PAST PSYCHIATRIC HISTORY: The patient has [name] long history of chronic psychotic illness and has been diagnosed with schizoaffective disorder. He has been hospitalized multiple times in the past. Most of his hospitalizations and treatments have been prompted by his bizarre behavior. He does not have any history of aggression or suicidal or homicidal ideations or plans. He also has [name] long history of abusing dextromethorphan. He has responded fairly well to antipsychotics in the past, but remains noncompliant with the medications. He also has history of complications from the medications.   PAST MEDICAL HISTORY: History of rhabdomyolysis, but he has recovered from it. He is not following with any PCP at this time.   SUBSTANCE ABUSE HISTORY: The patient has history of dextromethorphan abuse. Also has been abusing paint thinners in the past. He has history of marijuana.    FAMILY HISTORY: None.   SOCIAL HISTORY: The patient currently lives with his brother in [name] trailer home. He is on SSDI and gets [name] check on [name] monthly basis. The  patient has been incarcerated in the past. He tries to stay out of the way of the police for the most part.   CURRENT MEDICATIONS: None. He reported that he was planning to go to [facility], but never followed up with his appointments.   ALLERGIES: ANTIHISTAMINES, GEODON AND RISPERDAL.   REVIEW OF SYSTEMS:  PSYCHIATRIC: The patient currently denied having any suicidal or homicidal ideations or plans.  CARDIOPULMONARY: Denies any cough or chest pain, denies any orthopnea.  EYES: No double or blurred vision.   GASTROINTESTINAL: No abdominal pain, nausea, vomiting or diarrhea.  NEUROLOGIC: No numbness or weakness noted.   MENTAL STATUS EXAMINATION: The patient is [name] large built male who appeared his stated age. He has [name] large tattoo on his forehead. His mood was fine. Affect was congruent. His speech was rambling. He is unable to focus at one topic at [name] time. He does not appear to be an acute danger to himself or others. He remains noncompliant with his medications. He denied having any acute  paranoia.   LABORATORY DATA: Glucose 94, BUN 7, creatinine 0.73, sodium 139, potassium 3.4, chloride 113, bicarbonate 19, anion gap 7, calcium 8.7. Blood alcohol less than 3. Protein 7.0, albumin 3.8, bilirubin 0.3, alkaline phosphatase 131, AST 38, ALT 41. Urine drug screen is negative. WBC 7.8, RBC is 4.54, hemoglobin 13.3, hematocrit 40.2, platelet count 250, MCV 88, RDW 14.4.   VITAL SIGNS: Temperature 98.5, pulse 100, respirations 18, blood pressure 117.   DIAGNOSTIC IMPRESSION:  AXIS I: Schizoaffective, bipolar type; hallucinogen abuse.  AXIS II: None.  AXIS III: History of hypertension.   TREATMENT PLAN: I discussed with the patient at length about the medications, treatment risks, benefits and alternatives. I will start him on Haldol decanoate 100 mg IM monthly. He will be given the first dose at this time. He will follow up with [facility] once he is discharged from the hospital. The patient also wants [name] prescription for trazodone to help him with sleep at this time. The patient was advised about the side effects of the medication in detail, and he demonstrated understanding.   Thank you for allowing me to participate in the care of this patient.   ____________________________ Ardeen Fillers. Garnetta Buddy, MD usf:lb D: 05/25/2013 10:52:10 ET T: 05/25/2013 11:13:59 ET JOB#: [id number]  cc: Ardeen Fillers. Garnetta Buddy, MD, <Dictator> Rhunette Croft MD ELECTRONICALLY SIGNED 05/30/2013 13:59

## 2014-10-26 NOTE — Consult Note (Signed)
Brief Consult Note: Diagnosis: schizoaffective disorder.   Patient was seen by consultant.   Consult note dictated.   Recommend further assessment or treatment.   Orders entered.   Discussed with Attending MD.   Comments: Patient seen. Chart reviewed. Patient denies any suicidal ideation or plans. He will be given Haldol dec 100mg  IM before discharge and will [facility]. He is voluntary.   Plan; Prescription given Haldol 5mg  po qhs.  Cogentin 1mg  po qhs Trazodone 100mg  po qhs.  Follow up at [facility].  Electronic Signatures: Rhunette CroftFaheem, Kathlynn Swofford S (MD)  (Signed [phone]-Nov-14 10:58)  Authored: Brief Consult Note   Last Updated: 20-Nov-14 10:58 by Rhunette CroftFaheem, Alleigh Mollica S (MD)

## 2014-10-26 NOTE — Consult Note (Signed)
Psychiatry: Followup consult for this 39 year old man with a long-standing history of schizophrenia and abuse of dextromethorphan. Patient's chief complaint "I'm just walking". 39 year old man was brought to the emergency room after behaving bizarrely and in an agitated and disruptive manner in public. Apparently he just got out of prison a few days ago and immediately relapsed into abusing cough syrup. As is typical in that situation he became delirious and agitated and started displaying his bizarre psychotic behaviors. Here in the emergency room the patient was initially quite confused and disorganized. It has been over a day now and he seems to be getting a little bit better. He was able to describe a reasonably coherent recent history although he still is quite disorganized in his thinking and requires frequent redirection. He admits that he has been abusing cough syrup and is quite unapologetic about it. He totally denies taking any other psychiatric medicine and insists that no psychiatric medicine is of any benefit to him. He denies he's been abusing any other drugs. Denies any suicidal or homicidal ideation. psychiatric history: Patient well known to the community for his bizarre behavior. He has received psychiatric treatment in an outpatient for years with little benefit. Even forced psychiatric medicine seems to be of little change to his behavior. He does not have a history of serious suicide attempts or serious violence. Patient has been on long-term psychiatric units in the past also without a really significant change to his long-term functioning. abuse history: His drug of choice is dextromethorphan. He has suffered from it in the past having had an episode of heat stroke and rhabdomyolysis last year related to abuse. He currently has a very elevated CK again. history: Patient's mother attempts to be supportive although I think she is fed up with his resistance to improvement or treatment.  Patient just got out of prison and is not on probation. He does get disability. He gets his own money. of systems: Patient has few complaints. Denies mood problems. He does state that he is "pissed off" at being in the hospital. He does not make any direct threats. Denies suicidal ideation. Denies hallucinations. status exam: Bizarre looking gentleman with tattoos on his face. Only somewhat cooperative with the interview. Makes intense eye contact. Fidgety psychomotor activity. Trouble sitting still. Speech is flat in tone but increased in total amount. He mutters a lot and it is often difficult to understand him. Mood is stated as being angry. He denies suicidal or homicidal ideation. He denies hallucinations. He is frequently disorganized in his thinking with bizarre thoughts and paranoia. Alert and oriented x4. Poor insight and judgment. This patient with chronic schizophrenia is frequently disruptive in public but is not currently threatening himself or others. Psychiatric treatment has been of little benefit but he certainly is putting himself in danger when he abuses cough syrup. At this point he seems to still not be quite at his baseline. plan: I recommend letting him stay in the emergency room for another day to see how much better he gets. If he would like p.r.n. medicine for sleep that is fine but I would avoid large doses of anything intoxicating. Psychoeducation done with the patient about the dangers of his drug abuse. Reevaluate tomorrow. We will consider likely discharge with referral to outpatient treatment.  Electronic Signatures: Audery Amellapacs, John T (MD)  (Signed on 05-May-14 18:02)  Authored  Last Updated: 05-May-14 18:02 by Audery Amellapacs, John T (MD)

## 2014-10-26 NOTE — Consult Note (Signed)
PATIENT NAME:  Dylan [name], Dylan [name] MR#:  [id number] DATE OF BIRTH:  1975/11/06  DATE OF CONSULTATION:  04/28/2013  REFERRING PHYSICIAN:   CONSULTING PHYSICIAN:  Audery AmelJohn T. Brenlyn Beshara, MD  IDENTIFYING INFORMATION AND REASON FOR CONSULTATION: [name] 39 year old man with [name] history of schizoaffective disorder who presented to the hospital under involuntary commitment. Consultation for appropriate psychiatric disposition.   HISTORY OF PRESENT ILLNESS: Information obtained from the patient and the chart and our previous familiarity with him. This patient tells me that he called the police and reported that he was having suicidal ideation entirely so that he could get [name] ride here to the hospital. He was feeling [name] little bit edgy and out of sorts and felt like he probably needed to be taking some Haldol and this was the easiest way for him to obtain it. He tells me that he was not ever actually having any thoughts about killing himself. Denies any homicidal ideation. He freely describes his ongoing disorganized thinking and paranoia and that his mood has been somewhat edgy and anxious but he does not make any statements that are anymore bizarre than normal for him and he does not make any threatening statements. He was given an injection of short-acting haloperidol last night in the Emergency Room and was completely compliant with that. Today he tells me that he would like to be discharged and would appreciate it if we could give him [name] prescription for some Haldol and Ativan, although he understands if we cannot do that as well. The patient denies that he has been abusing drugs, specifically denying that he has been taking any dextromethorphan, which used to be his drug of choice. His life has been about the stable level of stress or lack thereof as usual.   PAST PSYCHIATRIC HISTORY: This gentleman has [name] long history of [name] chronic psychotic illness. He has had multiple hospitalizations in the past. Most of his hospitalizations and  treatment have been prompted by his bizarre behavior, rather than by any aggression or acute dangerousness. The most dangerous he has tended to be in recent years has been when he was abusing dextromethorphan and would get delirious from it. He has responded fairly well to antipsychotic medicine in the past, but he tends to be noncompliant with it. He has suffered medical complications from abusing dextromethorphan in the past which seems to have taught him [name] lesson.   PAST MEDICAL HISTORY: He has [name] past history of rhabdomyolysis, but he has recovered from it. He currently is not seeing [name] doctor regularly. The patient has [name] history of hypertension.   SUBSTANCE ABUSE HISTORY: As noted previously, his drug of choice has generally tended to be dextromethorphan, which he has abused aggressively. He also uses marijuana.  FAMILY HISTORY: None identified right now.   SOCIAL HISTORY: He does get [name] check. He went through [name] period of time after he did some jail time where it was interrupted, but it seems to have gotten fixed. He lives by himself in [name] boarding house. Mostly stays to himself. Tries to stay out of the way of the police for the most part.   CURRENT MEDICATIONS: Evidently not taking any on [name] regular basis right now.   ALLERGIES: ANTIHISTAMINES, GEODON and RISPERDAL. Those latter 2 having caused mostly extrapyramidal symptoms I believe.   REVIEW OF SYSTEMS: Denies suicidal or homicidal ideation. Denies current mood instability. Denies any anger or hostility. He has no specific physical complaints right now.   MENTAL STATUS EXAMINATION: Reasonably  clean but somewhat malodorous gentleman who looks his stated age or older, cooperative and pleasant with the interview. Eye contact good. Psychomotor activity [name] little fidgety but not agitated. Thoughts loose, scattered, [name] little disorganized but he is able to hold [name] basic conversation. Denies any acute hallucinations. Denies any suicidal or homicidal  ideation. Affect was euthymic, pleasant, reactive. Judgment and insight are partial, which is chronic and actually better than average for him. Mood is stated as being pretty good. Intelligence normal.   LABORATORY RESULTS: Drug screen positive for marijuana. TSH normal. Alcohol undetected.   CHEMISTRY: Slightly elevated chloride, slightly low CO2, slightly elevated alkaline phosphatase, none of any particular significance. CBC normal.   ASSESSMENT: This is [name] 39 year old gentleman with schizoaffective disorder who came into the hospital saying he was suicidal but has consistently stated that that was entirely just to get himself [name] ride. His affect is currently upbeat and cheerful he has multiple positive things in his life that he enjoys, especially reading and enjoying nature. He has positive plans for the future. He does not appear to be in acute danger to himself or others. He has been off of his medicine because he is chronically noncompliant with going to outpatient treatment.   TREATMENT PLAN: I negotiated with him in agreement that I will give him [name] prescription for Haldol 5 mg b.i.d. and Ativan 5 mg twice [name] day for 1 month. Despite his substance abuse issues, I think that the Ativan is not likely to be something that he would abuse and is probably going to help him to stay calmer. I educated him that he needs to go to [facility] for outpatient treatment in order to continue being on medicine to avoid the inconvenience of these repeat hospitalizations. He understands and agrees with the plan.   DIAGNOSES, PRINCIPAL AND PRIMARY: AXIS I:  Schizoaffective disorder, bipolar type.   SECONDARY DIAGNOSES: AXIS I: 1.  Marijuana abuse.  2.  Dextromethorphan abuse, in early remission.  AXIS II: Deferred.  AXIS III: Hypertension.  AXIS IV: Moderate from chronic impaired social relationships and burden of illness.  AXIS V: Functioning at time of evaluation 42.  ____________________________ Audery Amel, MD jtc:sb D: 04/28/2013 14:58:49 ET T: 04/28/2013 15:23:11 ET JOB#: [id number]  cc: Audery Amel, MD, <Dictator> Audery Amel MD ELECTRONICALLY SIGNED 04/28/2013 22:39

## 2014-10-26 NOTE — Consult Note (Signed)
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   Patient was seen by consultant.   Recommend further assessment or treatment.   Orders entered.   Discussed with Attending MD.   Comments: Pt seen in ED BHU. He was readmitted after the recent discharge from hospital. HE appeared more calm and has been responding to the medications. he is not exhibiting w/d and has reported that he has been compliant with meds.He does not have SI/HI or plans. Lives by himself.    Plan; Will call ED physician to discuss about his CK which is slightly elevated at 3000.  Continue Seroquel XR 300mg  po qhs  Discuss about d/c planning once stable.  Electronic Signatures: Rhunette CroftFaheem, Exie Chrismer S (MD)  (Signed 13-May-14 14:53)  Authored: Brief Consult Note   Last Updated: 13-May-14 14:53 by Rhunette CroftFaheem, Snigdha Howser S (MD)

## 2014-10-26 NOTE — Consult Note (Signed)
PATIENT NAME:  Dylan [name], Dylan [name] MR#:  [id number] DATE OF BIRTH:  04/01/1976  DATE OF CONSULTATION:  02/07/2013  REFERRING PHYSICIAN:  [place] SinkJade J. Dolores FrameSung, MD CONSULTING PHYSICIAN:  Ardeen FillersUzma S. Garnetta BuddyFaheem, MD  REASON FOR CONSULTATION: " I could not sleep. I needed [name] shot."   HISTORY OF PRESENT ILLNESS: The patient is [name] 39 year old male with [name] long history of mental illness and history of dextromethorphan abuse and schizoaffective disorder, presented again to the ED as he was requesting help as he was unable to sleep for [name] long time. The patient reported that he currently lives in [name] boarding home and has not been taking any medications. He reported that he started following up with [facility] and has been seeing [name] therapist named Neysa BonitoChristy. He reported that he has been having problems with his sleep and he woke up screaming [name] couple of days ago. He reported that he took Robitussin 8 pills yesterday. He stated that it affected his short-term memory and he started feeling dissociative. 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 The patient reported that he does not use any other substances other than dextromethorphan. This is [name] part of his religion and it will cure him, unlike medications that are prescribed. He denies any symptoms of depression, anxiety or psychosis. He remains bizarre and disorganized as usual. This is his baseline. He also has [name] tattoo on his forehead that has been there for many years. The patient reported that he feels that he his dissociative personalities talk to each other and keep him at his baseline. However, he does not have any thoughts to harm himself at this time and wanted to get some rest and then go back to his boarding home.   PAST PSYCHIATRIC HISTORY: The patient has been diagnosed many years ago with schizoaffective disorder. He has [facility]. He has been noncompliant with his  medications. He has also been followed with the ACT team but has stopped seeing them now. He has [name] long history of abuse of dextromethorphan and refuses antipsychotic medications on [name] fairly consistent basis.   FAMILY HISTORY: The patient has [name] history of schizophrenia in his grandmother. His maternal uncle committed suicide.   MEDICAL HISTORY: GERD.   ALLERGIES: RISPERDAL, ANTIHISTAMINES, GEODON.   CURRENT MEDICATIONS: None.   SOCIAL HISTORY: The patient is currently single and lives in [name] boarding home. He is currently on disability and has health insurance. He does not have any legal issues pending.   REVIEW OF SYSTEMS:  CONSTITUTIONAL: The patient currently denies any fever or chills. No weight changes.  EYES: No double or blurred vision.  RESPIRATORY: No shortness of breath or cough.  CARDIOVASCULAR: Denies any chest pain or orthopnea.  GASTROINTESTINAL: No abdominal pain, nausea, vomiting, diarrhea.  GENITOURINARY: No incontinence or frequency.  ENDOCRINE: No heat or cold intolerance.  LYMPHATIC: No anemia or easy bruising.  INTEGUMENTARY: No acne or rash.  MUSCULOSKELETAL: No muscle or joint pain.  NEUROLOGIC: No tingling or weakness.   VITAL SIGNS: Temperature 97.3, pulse 94, respirations 18, blood pressure 122/77.  LABORATORY DATA: Glucose 98, BUN 9, creatinine 1.10, sodium 140, potassium 3.4, chloride 109, bicarbonate 25, anion gap 6, osmolality 278, calcium 8.3. Protein 7.2, albumin 3.8, bilirubin 0.6, alkaline phosphatase 111, AST 101, ALT 46. TSH 1.90. Urine drug screen positive for PCP.   MENTAL STATUS EXAMINATION: The patient is [name] moderately built male who appeared his  stated age. He was calm, cooperative. He has [name] big tattoo on his forehead. He stated that he has 13 personalities. His mood was fine, somewhat expansive. Thought process was logical with his own logic. He denied any thoughts to harm himself. He continues to have somewhat delusional thinking. He remains  somewhat disorganized. His insight and judgment are poor.   DIAGNOSTIC IMPRESSION: AXIS I:  1.  Schizoaffective disorder, bipolar type.  2.  Dextromethorphan abuse.  AXIS II: None.  AXIS III: Gastroesophageal reflux disease.  AXIS IV: Severe.   TREATMENT PLAN: 1.  I will release patient from involuntary inpatient commitment, as he does not meet the criteria, and terminate the proceedings.  2.  The patient does not need any medication at this time and he is planning to go to [facility] to continue with his outpatient treatment. He will be discharged when he is medically stable. No medications prescribed at this time I advised the patient to return for [name] follow-up appointment and he demonstrated understanding.   Thank you for allowing me to participate in the care of this patient.   ____________________________ Ardeen Fillers. Garnetta Buddy, MD usf:jm D: 02/07/2013 13:14:02 ET T: 02/07/2013 14:50:48 ET JOB#: [id number]  cc: Ardeen Fillers. Garnetta Buddy, MD, <Dictator> Rhunette Croft MD ELECTRONICALLY SIGNED 02/16/2013 12:59
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
## 2014-10-27 NOTE — Consult Note (Signed)
PATIENT NAME:  Dylan [name], Dylan [name] MR#:  [id number] DATE OF BIRTH:  [birth year]  DATE OF CONSULTATION:  05/15/2014  REFERRING PHYSICIAN:  Enedina Finnerandolph N. Manson PasseyBrown, MD CONSULTING PHYSICIAN:  Cammy CopaPaul H. Nathanie Ottley, MD  REASON FOR CONSULTATION:  Right orbital floor fracture with possible entrapment.   HISTORY OF PRESENT ILLNESS: The patient is [name] 39 year old white male who presents with acute trauma to his right orbit area. He had [name] laceration of his right cheek. He had trauma to the eye that has caused some swelling and soreness there. It hurts [name] little bit when he looks upward. He also had acute psychosis and was hallucinating, and so has been followed in the Emergency Room. He had [name] CT scan done of his head, and assessment is made today of his orbital floor fracture, to see what is the most appropriate treatment for this.   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 He has also had ADD, as well.    CURRENT MEDICATIONS: As noted in the chart; reviewed.   DRUG ALLERGIES: RISPERDAL AND GEODON.  PSYCHOLOGICAL ISSUES: He has been hallucinating, but has not been depressed or hostile. He is not confused. He has not had any suicidal attempts or ingested any medications.   PHYSICAL EXAMINATION: The patient has bruising of his right lower eyelid. He has some scleral bruising as well. I do not see any hyphema. His vision appears to be okay. He has some limited extraocular movement on upgaze, where it is tender some, but he can move left and right and down. He can also pull to upper lateral and upper medial some, but again it hurts when he does that. His left eye does not have any problems or swelling. No swelling in his nose. There are no signs of nasal bleeding. The oropharynx is clear. He has [name] laceration of his right cheek, which has been repaired in the Emergency Room by Dr. Manson PasseyBrown. He has got some decreased sensation of his right cheek.   I reviewed  the CT scan in detail; this shows [name] blowout fracture of the left orbit. There is [name] fairly large hole here, and the inferior orbital muscle is depressed [name] little bit, but it does not appeared to be entrapped in any way. There is just some swelling around this. There is [name] little bit of blood in the maxillary sinus. No other fractures are evident on the CT scan.   ASSESSMENT AND PLAN: The patient has an inferior blowout fracture of the right orbit with some depression of the muscle. There is some limited extraocular movements when looking upward, but it does not appear that it is completely entrapped, but just swollen in the area. The patient will return in 1 week for suture removal of his cheek and, if there is still evidence of decreased movement at that time, we can consider doing surgery, but at this point it is unlikely that this is fully entrapped and that movement will improve as the swelling and bruising settles down.   The patient also should be evaluated by ophthalmology to make sure that there is no orbital trauma, and this can be done over the next day or 2. They can assess the mobility of the infraorbital nerve as well, to make sure that this is coming back to working normally.    ____________________________ Cammy CopaPaul H. Ednamae Schiano, MD phj:MT D: 05/15/2014 08:43:11 ET T: 05/15/2014 09:06:15 ET JOB#: [id number]  cc: Cammy CopaPaul H. Cleve Paolillo,  MD, <Dictator> Cammy Copa MD ELECTRONICALLY SIGNED 05/17/2014 19:00

## 2014-10-27 NOTE — Consult Note (Signed)
PATIENT NAME:  Dylan Dylan [name], Dylan Dylan [name] MR#:  [id number] DATE OF BIRTH:  1976/03/29  DATE OF CONSULTATION:  08/20/2013  REFERRING PHYSICIAN:   CONSULTING PHYSICIAN:  Aricela Bertagnolli K. Kanen Mottola, MD  SUBJECTIVE: The patient was seen in consultation in [facility] Emergency [facility]. The patient is Dylan [name] 39 year old white male who likes by himself and has long history of mental illness with chronic depression. The patient comes to the Emergency Room stating that he has no heating in his house and so he wanted help and came here. He stated he was suicidal at the time he came here.   HISTORY OF PRESENT ILLNESS: The patient reports that there was no heating in his apartment, probably he did not pay his dues, and so he started getting low and down.   OBJECTIVE: Dressed in hospital clothes, alert and oriented, calm, pleasant, and cooperative, and fully aware of the situation that brought him for admission here. Affect is bright and better because social services called his mother who stated that he can go over and stay with her for some time. No psychosis. Denies any ideas of plans to hurt himself or others. Contracts for safety and is glad that he can go home and he will get taxi voucher or  transportation. Insight and judgment guarded.   IMPRESSION: Major depressive disorder, recurrent, but currently contracts for safety.   PLAN: Discharge the patient after DC of IVC. The patient will keep his followup [facility] t [facility].   ____________________________ Jannet MantisSurya K. Guss Bundehalla, MD skc:sb D: 08/20/2013 23:25:24 ET T: 08/21/2013 07:14:52 ET JOB#: [id number]  cc: Monika SalkSurya K. Guss Bundehalla, MD, <Dictator> Beau FannySURYA K Zali Kamaka MD ELECTRONICALLY SIGNED 08/21/2013 9:34

## 2014-11-17 ENCOUNTER — Emergency Department
Admission: EM | Admit: 2014-11-17 | Discharge: 2014-11-17 | Payer: Medicare Other | Attending: Emergency Medicine | Admitting: Emergency Medicine

## 2014-11-17 ENCOUNTER — Emergency Department: Payer: Medicare Other

## 2014-11-17 DIAGNOSIS — Z720 Tobacco use: Secondary | ICD-10-CM | POA: Insufficient documentation

## 2014-11-17 DIAGNOSIS — Y998 Other external cause status: Secondary | ICD-10-CM | POA: Insufficient documentation

## 2014-11-17 DIAGNOSIS — S42491A Other displaced fracture of lower end of right humerus, initial encounter for closed fracture: Secondary | ICD-10-CM | POA: Insufficient documentation

## 2014-11-17 DIAGNOSIS — Y9375 Activity, martial arts: Secondary | ICD-10-CM | POA: Insufficient documentation

## 2014-11-17 DIAGNOSIS — W010XXA Fall on same level from slipping, tripping and stumbling without subsequent striking against object, initial encounter: Secondary | ICD-10-CM | POA: Insufficient documentation

## 2014-11-17 DIAGNOSIS — Y9289 Other specified places as the place of occurrence of the external cause: Secondary | ICD-10-CM | POA: Insufficient documentation

## 2014-11-17 DIAGNOSIS — S42401A Unspecified fracture of lower end of right humerus, initial encounter for closed fracture: Secondary | ICD-10-CM

## 2014-11-17 MED ORDER — ONDANSETRON HCL 4 MG/2ML IJ SOLN
INTRAMUSCULAR | Status: AC
  Administered 2014-11-17: 4 mg via INTRAVENOUS
  Filled 2014-11-17: qty 2

## 2014-11-17 MED ORDER — MORPHINE SULFATE 4 MG/ML IJ SOLN
INTRAMUSCULAR | Status: AC
  Administered 2014-11-17: 4 mg via INTRAVENOUS
  Filled 2014-11-17: qty 1

## 2014-11-17 MED ORDER — ONDANSETRON HCL 4 MG/2ML IJ SOLN
4.0000 mg | Freq: Once | INTRAMUSCULAR | Status: AC
  Administered 2014-11-17: 4 mg via INTRAVENOUS

## 2014-11-17 MED ORDER — MORPHINE SULFATE 4 MG/ML IJ SOLN
4.0000 mg | Freq: Once | INTRAMUSCULAR | Status: AC
Start: 2014-11-17 — End: 2014-11-17
  Administered 2014-11-17: 4 mg via INTRAVENOUS

## 2014-11-17 MED ORDER — MORPHINE SULFATE 4 MG/ML IJ SOLN
4.0000 mg | Freq: Once | INTRAMUSCULAR | Status: AC
  Administered 2014-11-17: 4 mg via INTRAVENOUS

## 2014-11-17 MED ORDER — ONDANSETRON HCL 4 MG/2ML IJ SOLN
INTRAMUSCULAR | Status: AC
  Filled 2014-11-17: qty 2

## 2014-11-17 NOTE — ED Notes (Signed)
Sung, MD at bedside. 

## 2014-11-17 NOTE — ED Provider Notes (Signed)
[facility] Emergency Department Provider Note  ____________________________________________  Time seen: Approximately 3:10 AM  I have reviewed the triage vital signs and the nursing notes.   HISTORY  Chief Complaint Arm Injury    HPI Dylan Baloed A Thieme Jr. is a 39 y.o. male who presents with EMS status post right elbow injury. Patient states he was doing martial arts with a buddy when he slipped and fell directly onto his right elbow. Patient denies striking head or LOC. Complains of 8/10 pain to right elbowwith swelling. Denies numbness or tingling. Pain worse with movement. Patient is right hand dominant.   History reviewed. No pertinent past medical history. History of mental illness  There are no active problems to display for this patient.   History reviewed. No pertinent past surgical history.  No current outpatient prescriptions on file.  Allergies Antihistamines, diphenhydramine-type; Prolixin; and Risperidone and related  History reviewed. No pertinent family history.  Social History History  Substance Use Topics  . Smoking status: Current Every Day Smoker -- 1.00 packs/day for 20 years    Types: Cigarettes  . Smokeless tobacco: Not on file  . Alcohol Use: Yes     Comment: occassional use socially    Review of Systems Constitutional: No fever/chills Eyes: No visual changes. ENT: No sore throat. Cardiovascular: Denies chest pain. Respiratory: Denies shortness of breath. Gastrointestinal: No abdominal pain.  No nausea, no vomiting.  No diarrhea.  No constipation. Genitourinary: Negative for dysuria. Musculoskeletal: Negative for back pain. Negative for neck pain. Positive for right elbow pain and swelling. Skin: Negative for rash. Neurological: Negative for headaches, focal weakness or numbness.  10-point ROS otherwise negative.  ____________________________________________   PHYSICAL EXAM:  VITAL SIGNS: ED Triage Vitals  Enc  Vitals Group     BP 11/17/14 0147 120/78 mmHg     Pulse Rate 11/17/14 0147 96     Resp --      Temp 11/17/14 0147 97.6 F (36.4 C)     Temp Source 11/17/14 0147 Oral     SpO2 11/17/14 0141 94 %     Weight 11/17/14 0147 175 lb (79.379 kg)     Height 11/17/14 0147 6\' 2"  (1.88 m)     Head Cir --      Peak Flow --      Pain Score 11/17/14 0148 7     Pain Loc --      Pain Edu? --      Excl. in GC? --     Constitutional: Alert and oriented. Well appearing and in no acute distress. Eyes: Conjunctivae are normal. PERRL. EOMI. Head: Atraumatic. Tattoo on forehead. Nose: No congestion/rhinnorhea. Mouth/Throat: Mucous membranes are moist.  Oropharynx non-erythematous. Neck: No stridor. No cervical spine tenderness to palpation. Cardiovascular: Normal rate, regular rhythm. Grossly normal heart sounds.  Good peripheral circulation. Respiratory: Normal respiratory effort.  No retractions. Lungs CTAB. Gastrointestinal: Soft and nontender. No distention. No abdominal bruits. No CVA tenderness. Musculoskeletal: Right elbow with moderate swelling and tenderness to palpation. 2+ distal pulses. Brisk capillary refill. Motor strength intact to grip. No lower extremity tenderness nor edema.   Neurologic:  Normal speech and language. No gross focal neurologic deficits are appreciated. Speech is normal. No gait instability. Skin:  Skin is warm, dry and intact. No rash noted. Psychiatric: Mood and affect are normal. Speech and behavior are normal.  ____________________________________________   LABS (all labs ordered are listed, but only abnormal results are displayed)  Labs Reviewed - No data  to display ____________________________________________  EKG  None ____________________________________________  RADIOLOGY  Right elbow x-rays (reviewed by me, interpreted by Dr. Andria MeuseStevens):  Comminuted fracture in dislocation of the distal right humerus and right elbow as  described. ____________________________________________   PROCEDURES  Procedure(s) performed: None  Critical Care performed: No  ____________________________________________   INITIAL IMPRESSION / ASSESSMENT AND PLAN / ED COURSE  Pertinent labs & imaging results that were available during my care of the patient were reviewed by me and considered in my medical decision making (see chart for details).  39 year old male status post fall with right elbow injury. Patient states he has had nothing to eat since 1 PM; last had water approximately one hour prior to arrival. Discussed with orthopedics on call (Dr. Hyacinth MeekerMiller) who will review x-rays and call me back with recommendations for plan of care.  ----------------------------------------- 3:24 AM on 11/17/2014 -----------------------------------------  Discussed with Dr. Hyacinth MeekerMiller who reviewed x-rays; recommends transfer to tertiary care center given severity of patient's fracture and dislocation. Recommends posterior long-arm splint for transport. Updated patient and family member; [facility].  ----------------------------------------- 4:13 AM on 11/17/2014 -----------------------------------------  [facility]). Updated patient and friend. Patient will be [facility] by [facility].   SPLINT APPLICATION Date/Time: Splint applied prior to transport Authorized by: Irean HongSUNG,JADE J Consent: Verbal consent obtained. Risks and benefits: risks, benefits and alternatives were discussed Consent given by: patient Splint applied by: ED technician Location details: Right upper extremity  Splint type: Posterior long arm  Supplies used: OCL, web roll, Ace bandage  Post-procedure: The splinted body part was neurovascularly unchanged following the procedure. Patient tolerance: Patient tolerated the procedure well with no immediate complications.    ____________________________________________   FINAL  CLINICAL IMPRESSION(S) / ED DIAGNOSES  Final diagnoses:  Elbow fracture, right, closed, initial encounter  Humerus distal fracture, right, closed, initial encounter      Irean HongJade J Sung, MD 11/17/14 [phone]

## 2014-11-17 NOTE — ED Notes (Signed)
Pt to ED via [facility] d/t right elbow injury from a fall. Pt is not able to bend elbow at this time. Distal fingers and hand on affected side are warm, pink, and dry, CMS intact. Some swelling and redness noted to the affected elbow, but no obvious deformity. Pt is A&O, in NAD, talkative and cooperative.

## 2016-04-09 ENCOUNTER — Emergency Department
Admission: EM | Admit: 2016-04-09 | Discharge: 2016-04-09 | Disposition: A | Discharge status: Home / Self Care | Payer: Medicare Other | Attending: Emergency Medicine | Admitting: Emergency Medicine

## 2016-04-09 ENCOUNTER — Encounter: Payer: Self-pay | Admitting: Emergency Medicine

## 2016-04-09 DIAGNOSIS — F17210 Nicotine dependence, cigarettes, uncomplicated: Secondary | ICD-10-CM | POA: Insufficient documentation

## 2016-04-09 DIAGNOSIS — K029 Dental caries, unspecified: Secondary | ICD-10-CM | POA: Insufficient documentation

## 2016-04-09 MED ORDER — AMOXICILLIN 500 MG PO CAPS: 500.0000 mg | ORAL_CAPSULE | Freq: Three times a day (TID) | ORAL | 0 refills | Status: DC

## 2016-04-09 NOTE — ED Provider Notes (Signed)
[facility] Emergency Department Provider Note   ____________________________________________   None    (approximate)  I have reviewed the triage vital signs and the nursing notes.   HISTORY  Chief Complaint Dental Pain   HPI Dylan [name]. is a 40 y.o. male is here with complaint of dental pain for one week. Patient states that he has not taken any medication during the week that he has had pain. He has not called a dentist. Patient rates his pain as 4/10. He continues to smoke one pack of cigarettes per day. He denies any other health problems.   History reviewed. No pertinent past medical history.  There are no active problems to display for this patient.   History reviewed. No pertinent surgical history.  Prior to Admission medications   Medication Sig Start Date End Date Taking? Authorizing Provider  amoxicillin (AMOXIL) 500 MG capsule Take 1 capsule (500 mg total) by mouth 3 (three) times daily. 04/09/16   Tommi Rumps, PA-C    Allergies Antihistamines, diphenhydramine-type; Prolixin [fluphenazine]; and Risperidone and related  No family history on file.  Social History Social History  Substance Use Topics  . Smoking status: Current Every Day Smoker    Packs/day: 1.00    Years: 20.00    Types: Cigarettes  . Smokeless tobacco: Never Used  . Alcohol use Yes     Comment: occassional use socially    Review of Systems Constitutional: No fever/chills ENT: Positive dental pain. Cardiovascular: Denies chest pain. Respiratory: Denies shortness of breath. Gastrointestinal:   No nausea, no vomiting.  Neurological: Negative for headaches  10-point ROS otherwise negative.  ____________________________________________   PHYSICAL EXAM:  VITAL SIGNS: ED Triage Vitals  Enc Vitals Group     BP 04/09/16 0709 117/72     Pulse Rate 04/09/16 0709 100     Resp 04/09/16 0709 18     Temp 04/09/16 0709 97.7 F (36.5 C)     Temp  Source 04/09/16 0709 Oral     SpO2 04/09/16 0709 100 %     Weight 04/09/16 0707 210 lb (95.3 kg)     Height 04/09/16 0707 6\' 2"  (1.88 m)     Head Circumference --      Peak Flow --      Pain Score 04/09/16 0707 4     Pain Loc --      Pain Edu? --      Excl. in GC? --     Constitutional: Alert and oriented. Well appearing and in no acute distress. Eyes: Conjunctivae are normal. PERRL. EOMI. Head: Atraumatic. Nose: No congestion/rhinnorhea. Mouth/Throat: Mucous membranes are moist.  Oropharynx non-erythematous.Left lower molar in very poor repair and edges of what is left is sticking above the gumline however patient has a large cavity present. There is no active drainage. Neck: No stridor.   Hematological/Lymphatic/Immunilogical: No cervical lymphadenopathy. Cardiovascular: Normal rate, regular rhythm. Grossly normal heart sounds.  Good peripheral circulation. Respiratory: Normal respiratory effort.  No retractions. Lungs CTAB. Musculoskeletal: Moves upper and lower extremities without any difficulty. Neurologic:  Normal speech and language. No gross focal neurologic deficits are appreciated. No gait instability. Skin:  Skin is warm, dry and intact. No rash noted. Psychiatric: Mood and affect are normal. Speech and behavior are normal.  ____________________________________________   LABS (all labs ordered are listed, but only abnormal results are displayed)  Labs Reviewed - No data to display   PROCEDURES  Procedure(s) performed: None  Procedures  Critical Care performed: No  ____________________________________________   INITIAL IMPRESSION / ASSESSMENT AND PLAN / ED COURSE  Pertinent labs & imaging results that were available during my care of the patient were reviewed by me and considered in my medical decision making (see chart for details).    Clinical Course   Patient was placed on amoxicillin 500 mg 3 times a day for 10 days. [place] [facility] [place]. Patient will continue taking ibuprofen at home.  ____________________________________________   FINAL CLINICAL IMPRESSION(S) / ED DIAGNOSES  Final diagnoses:  Pain due to dental caries      NEW MEDICATIONS STARTED DURING THIS VISIT:  Discharge Medication List as of 04/09/2016  7:35 AM    START taking these medications   Details  amoxicillin (AMOXIL) 500 MG capsule Take 1 capsule (500 mg total) by mouth 3 (three) times daily., Starting Thu 04/09/2016, Print         Note:  This document was prepared using Dragon voice recognition software and may include unintentional dictation errors.    Tommi Rumpshonda L Cristol Engdahl, PA-C 04/09/16 1127    Jennye MoccasinBrian S Quigley, MD 04/09/16 [phone]

## 2016-04-09 NOTE — Discharge Instructions (Signed)
Contact your dentist for an appointment. Take all of the amoxicillin for the next 10 days.

## 2016-04-09 NOTE — ED Triage Notes (Signed)
Pt to ed with c/o dental pain x 1 week

## 2016-12-19 ENCOUNTER — Encounter: Payer: Self-pay | Admitting: Emergency Medicine

## 2016-12-19 ENCOUNTER — Emergency Department
Admission: EM | Admit: 2016-12-19 | Discharge: 2016-12-19 | Disposition: A | Discharge status: Home / Self Care | Payer: Medicare Other | Attending: Emergency Medicine | Admitting: Emergency Medicine

## 2016-12-19 DIAGNOSIS — K0889 Other specified disorders of teeth and supporting structures: Secondary | ICD-10-CM | POA: Insufficient documentation

## 2016-12-19 DIAGNOSIS — F17210 Nicotine dependence, cigarettes, uncomplicated: Secondary | ICD-10-CM | POA: Insufficient documentation

## 2016-12-19 DIAGNOSIS — K047 Periapical abscess without sinus: Secondary | ICD-10-CM | POA: Insufficient documentation

## 2016-12-19 MED ORDER — IBUPROFEN 800 MG PO TABS
800.0000 mg | ORAL_TABLET | Freq: Once | ORAL | Status: AC
  Administered 2016-12-19: 800 mg via ORAL
  Filled 2016-12-19: qty 1

## 2016-12-19 MED ORDER — HYDROCODONE-ACETAMINOPHEN 5-325 MG PO TABS: 1.0000 | ORAL_TABLET | Freq: Four times a day (QID) | ORAL | 0 refills | Status: DC | PRN

## 2016-12-19 MED ORDER — IBUPROFEN 800 MG PO TABS: 800.0000 mg | ORAL_TABLET | Freq: Three times a day (TID) | ORAL | 0 refills | Status: DC | PRN

## 2016-12-19 MED ORDER — AMOXICILLIN 500 MG PO CAPS
500.0000 mg | ORAL_CAPSULE | Freq: Once | ORAL | Status: AC
  Administered 2016-12-19: 500 mg via ORAL
  Filled 2016-12-19: qty 1

## 2016-12-19 MED ORDER — AMOXICILLIN 500 MG PO CAPS: 500.0000 mg | ORAL_CAPSULE | Freq: Three times a day (TID) | ORAL | 0 refills | Status: DC

## 2016-12-19 MED ORDER — HYDROCODONE-ACETAMINOPHEN 5-325 MG PO TABS
1.0000 | ORAL_TABLET | Freq: Once | ORAL | Status: AC
  Administered 2016-12-19: 1 via ORAL
  Filled 2016-12-19: qty 1

## 2016-12-19 NOTE — ED Provider Notes (Signed)
[facility] Emergency Department Provider Note   ____________________________________________   First MD Initiated Contact with Patient 12/19/16 0530     (approximate)  I have reviewed the triage vital signs and the nursing notes.   HISTORY  Chief Complaint Dental Pain    HPI Colon Rueth. is a 41 y.o. male who presents to the ED from home with a chief complaint of toothache. Patient reports he has had a broken left lower molar for quite some time but it started to throb 2 days ago and patient is concern for infection. Denies associated fever, chills, chest pain, shortness of breath, abdominal pain, nausea, vomiting. Denies recent travel or trauma. Nothing makes his symptoms better or worse.   Past medical history Mental illness None for diabetes  There are no active problems to display for this patient.   History reviewed. No pertinent surgical history.  Prior to Admission medications   Medication Sig Start Date End Date Taking? Authorizing Provider  amoxicillin (AMOXIL) 500 MG capsule Take 1 capsule (500 mg total) by mouth 3 (three) times daily. 12/19/16   Irean Hong, MD  HYDROcodone-acetaminophen (NORCO) 5-325 MG tablet Take 1 tablet by mouth every 6 (six) hours as needed for moderate pain. 12/19/16   Irean Hong, MD  ibuprofen (ADVIL,MOTRIN) 800 MG tablet Take 1 tablet (800 mg total) by mouth every 8 (eight) hours as needed for moderate pain. 12/19/16   Irean Hong, MD    Allergies Antihistamines, diphenhydramine-type; Prolixin [fluphenazine]; and Risperidone and related  History reviewed. No pertinent family history.  Social History Social History  Substance Use Topics  . Smoking status: Current Every Day Smoker    Packs/day: 1.00    Years: 20.00    Types: Cigarettes  . Smokeless tobacco: Never Used  . Alcohol use Yes     Comment: occassional use socially    Review of Systems  Constitutional: No fever/chills. Eyes: No  visual changes. ENT: Positive for toothache. No sore throat. Cardiovascular: Denies chest pain. Respiratory: Denies shortness of breath. Gastrointestinal: No abdominal pain.  No nausea, no vomiting.  No diarrhea.  No constipation. Genitourinary: Negative for dysuria. Musculoskeletal: Negative for back pain. Skin: Negative for rash. Neurological: Negative for headaches, focal weakness or numbness.   ____________________________________________   PHYSICAL EXAM:  VITAL SIGNS: ED Triage Vitals  Enc Vitals Group     BP 12/19/16 0505 (!) 143/84     Pulse Rate 12/19/16 0505 79     Resp 12/19/16 0505 18     Temp 12/19/16 0505 97.9 F (36.6 C)     Temp Source 12/19/16 0505 Oral     SpO2 12/19/16 0505 96 %     Weight 12/19/16 0506 217 lb (98.4 kg)     Height 12/19/16 0506 6\' 2"  (1.88 m)     Head Circumference --      Peak Flow --      Pain Score 12/19/16 0505 1     Pain Loc --      Pain Edu? --      Excl. in GC? --     Constitutional: Alert and oriented. Well appearing and in no acute distress. Eyes: Conjunctivae are normal.  Head: Atraumatic. Nose: No congestion/rhinnorhea. Mouth/Throat: Left lower molar with previously broken tooth. Mild swelling around gum line concerning for dental abscess. There is no facial swelling. Mucous membranes are moist.  Oropharynx non-erythematous. Neck: No stridor.   Cardiovascular: Normal rate, regular rhythm. Grossly normal heart  sounds.  Good peripheral circulation. Respiratory: Normal respiratory effort.  No retractions. Lungs CTAB. Gastrointestinal: Soft and nontender. No distention. No abdominal bruits. No CVA tenderness. Musculoskeletal: No lower extremity tenderness nor edema.  No joint effusions. Neurologic:  Normal speech and language. No gross focal neurologic deficits are appreciated. No gait instability. Skin:  Skin is warm, dry and intact. No rash noted. Psychiatric: Mood and affect are normal. Speech and behavior are  normal.  ____________________________________________   LABS (all labs ordered are listed, but only abnormal results are displayed)  Labs Reviewed - No data to display ____________________________________________  EKG  None ____________________________________________  RADIOLOGY  No results found.  ____________________________________________   PROCEDURES  Procedure(s) performed: None  Procedures  Critical Care performed: No  ____________________________________________   INITIAL IMPRESSION / ASSESSMENT AND PLAN / ED COURSE  Pertinent labs & imaging results that were available during my care of the patient were reviewed by me and considered in my medical decision making (see chart for details).  41 year old male who presents with dentalgia secondary to dental abscess. Will start amoxicillin, analgesia and patient will follow-up with his dentist next week. Strict return precautions given. Patient verbalizes understanding and agrees with plan of care.      ____________________________________________   FINAL CLINICAL IMPRESSION(S) / ED DIAGNOSES  Final diagnoses:  Dentalgia  Dental infection      NEW MEDICATIONS STARTED DURING THIS VISIT:  New Prescriptions   AMOXICILLIN (AMOXIL) 500 MG CAPSULE    Take 1 capsule (500 mg total) by mouth 3 (three) times daily.   HYDROCODONE-ACETAMINOPHEN (NORCO) 5-325 MG TABLET    Take 1 tablet by mouth every 6 (six) hours as needed for moderate pain.   IBUPROFEN (ADVIL,MOTRIN) 800 MG TABLET    Take 1 tablet (800 mg total) by mouth every 8 (eight) hours as needed for moderate pain.     Note:  This document was prepared using Dragon voice recognition software and may include unintentional dictation errors.    Irean HongSung, Jade J, MD 12/19/16 [phone]

## 2016-12-19 NOTE — Discharge Instructions (Signed)
1. Take antibiotic as prescribed (Amoxicillin 500mg three times daily x 7 days). 2. Take pain medicines as needed (Motrin/Norco #15). 3. Return to the ER for worsening symptoms, persistent vomiting, fever, difficulty breathing or other concerns.   

## 2016-12-19 NOTE — ED Notes (Signed)
Patient with complaint of right lower dental pain times 2 days. Patient states that his tooth is broken and that he thinks that it is infected.

## 2016-12-19 NOTE — ED Triage Notes (Signed)
Pt reports he has a broken tooth on the left lower jaw for some time now but is not able to afford to have it removed. Reports it is starting to throb and he thinks it may be getting infected.

## 2017-01-18 ENCOUNTER — Telehealth: Payer: Self-pay

## 2017-01-18 NOTE — Telephone Encounter (Signed)
PT IS NOT A PATIENT HERE IN THE OFFICE. PT STATES YOU HAVE SEEN HIM IN THE HOSPITAL AND THAT HE HAS PAPERS FROM [facility] DUE TO NO PCP PT WANTED TO FIND OUT IF YOU WOULD FILL OUT PAPERWORK.

## 2017-01-19 NOTE — Telephone Encounter (Signed)
No. The patient is in no way a patient of mine. I would not fill these papers out.

## 2017-01-19 NOTE — Telephone Encounter (Signed)
Ok thank you I will notify patient.

## 2017-03-24 ENCOUNTER — Encounter: Payer: Self-pay | Admitting: Emergency Medicine

## 2017-03-24 ENCOUNTER — Emergency Department
Admission: EM | Admit: 2017-03-24 | Discharge: 2017-03-24 | Disposition: A | Discharge status: Home / Self Care | Payer: Worker's Compensation | Attending: Emergency Medicine | Admitting: Emergency Medicine

## 2017-03-24 DIAGNOSIS — Y92511 Restaurant or cafe as the place of occurrence of the external cause: Secondary | ICD-10-CM | POA: Diagnosis not present

## 2017-03-24 DIAGNOSIS — Y939 Activity, unspecified: Secondary | ICD-10-CM | POA: Diagnosis not present

## 2017-03-24 DIAGNOSIS — Z23 Encounter for immunization: Secondary | ICD-10-CM | POA: Insufficient documentation

## 2017-03-24 DIAGNOSIS — Y990 Civilian activity done for income or pay: Secondary | ICD-10-CM | POA: Insufficient documentation

## 2017-03-24 DIAGNOSIS — S0100XA Unspecified open wound of scalp, initial encounter: Secondary | ICD-10-CM | POA: Diagnosis present

## 2017-03-24 DIAGNOSIS — F17210 Nicotine dependence, cigarettes, uncomplicated: Secondary | ICD-10-CM | POA: Insufficient documentation

## 2017-03-24 DIAGNOSIS — W268XXA Contact with other sharp object(s), not elsewhere classified, initial encounter: Secondary | ICD-10-CM | POA: Diagnosis not present

## 2017-03-24 DIAGNOSIS — S0101XA Laceration without foreign body of scalp, initial encounter: Secondary | ICD-10-CM | POA: Insufficient documentation

## 2017-03-24 HISTORY — DX: Hyperlipidemia, unspecified: E78.5

## 2017-03-24 MED ORDER — LIDOCAINE-EPINEPHRINE-TETRACAINE (LET) SOLUTION
NASAL | Status: AC
  Administered 2017-03-24: 3 mL via TOPICAL
  Filled 2017-03-24: qty 3

## 2017-03-24 MED ORDER — TETANUS-DIPHTH-ACELL PERTUSSIS 5-2.5-18.5 LF-MCG/0.5 IM SUSP
0.5000 mL | Freq: Once | INTRAMUSCULAR | Status: AC
  Administered 2017-03-24: 0.5 mL via INTRAMUSCULAR
  Filled 2017-03-24: qty 0.5

## 2017-03-24 MED ORDER — LIDOCAINE-EPINEPHRINE-TETRACAINE (LET) SOLUTION
3.0000 mL | Freq: Once | NASAL | Status: AC
  Administered 2017-03-24: 3 mL via TOPICAL

## 2017-03-24 NOTE — ED Notes (Signed)
Laceration cleaned with NS. Bleeding remains contained.

## 2017-03-24 NOTE — ED Triage Notes (Signed)
Patient states that he was at work and bent down and when he stood up he hit his head on a metal rack. Patient with laceration to the top of head with bleeding controlled.

## 2017-03-24 NOTE — ED Provider Notes (Signed)
[facility] Emergency Department Provider Note   ____________________________________________   I have reviewed the triage vital signs and the nursing notes.   HISTORY  Chief Complaint Laceration    HPI Dylan [name]. is a 41 y.o. male presents emergency department with a laceration to the superior aspect of the scalp he sustained when he leaned into a piece of metal while emptying the trash at [employer] earlier this evening. Patient denies any other injuries, head or neck injury. He maintained hemorrhage control and nearly following the injury. Patient does not recall his last tetanus shot. Patient reports history of schizophrenia and is compliant with all medications otherwise history is unremarkable. Patient denies fever, chills, headache, vision changes, chest pain, chest tightness, shortness of breath, abdominal pain, nausea and vomiting.  Past Medical History:  Diagnosis Date  . Hyperlipemia     There are no active problems to display for this patient.   Past Surgical History:  Procedure Laterality Date  . ELBOW SURGERY Right     Prior to Admission medications   Medication Sig Start Date End Date Taking? Authorizing Provider  amoxicillin (AMOXIL) 500 MG capsule Take 1 capsule (500 mg total) by mouth 3 (three) times daily. 12/19/16   Irean Hong, MD  HYDROcodone-acetaminophen (NORCO) 5-325 MG tablet Take 1 tablet by mouth every 6 (six) hours as needed for moderate pain. 12/19/16   Irean Hong, MD  ibuprofen (ADVIL,MOTRIN) 800 MG tablet Take 1 tablet (800 mg total) by mouth every 8 (eight) hours as needed for moderate pain. 12/19/16   Irean Hong, MD    Allergies Antihistamines, diphenhydramine-type; Prolixin [fluphenazine]; and Risperidone and related  No family history on file.  Social History Social History  Substance Use Topics  . Smoking status: Current Every Day Smoker    Packs/day: 1.00    Years: 20.00    Types: Cigarettes  .  Smokeless tobacco: Never Used  . Alcohol use Yes     Comment: occassional use socially    Review of Systems Constitutional: Negative for fever/chills Eyes: No visual changes. ENT:  Negative for sore throat and for difficulty swallowing Cardiovascular: Denies chest pain. Respiratory: Denies cough. Denies shortness of breath. Gastrointestinal: No abdominal pain.  No nausea, vomiting, diarrhea. Genitourinary: Negative for dysuria. Musculoskeletal: Negative for back pain. Skin: Negative for rash. Laceration to the head.  Neurological: Negative for headaches.  Negative focal weakness or numbness. Negative for loss of consciousness. Able to ambulate. ____________________________________________   PHYSICAL EXAM:  VITAL SIGNS: ED Triage Vitals  Enc Vitals Group     BP 03/24/17 1932 133/80     Pulse Rate 03/24/17 1932 85     Resp 03/24/17 1932 18     Temp 03/24/17 1932 97.9 F (36.6 C)     Temp Source 03/24/17 1932 Oral     SpO2 03/24/17 1932 98 %     Weight 03/24/17 1932 220 lb (99.8 kg)     Height 03/24/17 1932  (1.88 m)     Head Circumference --      Peak Flow --      Pain Score 03/24/17 1934 0     Pain Loc --      Pain Edu? --      Excl. in GC? --     Constitutional: Alert and oriented. Well appearing and in no acute distress.  Eyes: Conjunctivae are normal. PERRL. EOMI  Head: Normocephalic and atraumatic. ENT:  Ears: Canals clear. TMs intact bilaterally.      Nose: No congestion/rhinnorhea.      Mouth/Throat: Mucous membranes are moist. Neck:Supple. No thyromegaly. No stridor.  Cardiovascular: Normal rate, regular rhythm. Normal S1 and S2.  Good peripheral circulation. Respiratory: Normal respiratory effort without tachypnea or retractions. Lungs CTAB.  Cardiovascular: Normal rate, regular rhythm. Normal distal pulses. Musculoskeletal: Nontender with normal range of motion in all extremities. Neurologic: Normal speech and language.  Skin:  Skin is warm, dry  and intact. No rash noted. Psychiatric: Mood and affect are normal. Speech and behavior are normal. Patient exhibits appropriate insight and judgement.  ____________________________________________   LABS (all labs ordered are listed, but only abnormal results are displayed)  Labs Reviewed - No data to display ____________________________________________  EKG none ____________________________________________  RADIOLOGY none ____________________________________________   PROCEDURES  Procedure(s) performed:  LACERATION REPAIR Performed by: Clois Comber Authorized by: Clois Comber Consent: Verbal consent obtained. Risks and benefits: risks, benefits and alternatives were discussed Consent given by: patient Patient identity confirmed: provided demographic data Prepped and Draped in normal sterile fashion Wound explored  Laceration Location: superior scalp  Laceration Length: 5.5 cm  No Foreign Bodies seen or palpated  Anesthesia: topical  Local anesthetic: LET  Anesthetic total: 3.0  Irrigation method: syringe Amount of cleaning: standard  Skin closure: Staples  Number of staples: 6 staples  Technique: Staples  Patient tolerance: Patient tolerated the procedure well with no immediate complications.   Critical Care performed: no ____________________________________________   INITIAL IMPRESSION / ASSESSMENT AND PLAN / ED COURSE  Pertinent labs & imaging results that were available during my care of the patient were reviewed by me and considered in my medical decision making (see chart for details).  Patient sustained a laceration to the superior scalp. Laceration required staple closure as noted above. Patient tolerated procedure well. Patient sustained no other injuries other than laceration requiring staple closure repair. Pt instructed to keep wound clean and dry and will return to the emergency department or PCP for staple removal in 5-6 days.  Patient also instructed to watch for signs of infection and return if changes are noted.  Patient informed of clinical course, understand medical decision-making process, and agree with plan.  ____________________________________________   FINAL CLINICAL IMPRESSION(S) / ED DIAGNOSES  Final diagnoses:  Scalp laceration, initial encounter       NEW MEDICATIONS STARTED DURING THIS VISIT:  Discharge Medication List as of 03/24/2017  8:57 PM       Note:  This document was prepared using Dragon voice recognition software and may include unintentional dictation errors.    Melenda Bielak, Karl Pock 03/24/17 2111    Merrily Brittle, MD 03/24/17 2332

## 2017-03-24 NOTE — Discharge Instructions (Signed)
Return to an Urgent Care, primary care provider or the emergency department in 5-6 days for staple removal.   Monitor wound for signs of infection. If you note infections return to your primary care provider or the emergency department.

## 2017-03-30 ENCOUNTER — Encounter: Payer: Self-pay | Admitting: Emergency Medicine

## 2017-03-30 ENCOUNTER — Emergency Department
Admission: EM | Admit: 2017-03-30 | Discharge: 2017-03-30 | Disposition: A | Discharge status: Home / Self Care | Payer: Medicare Other | Attending: Emergency Medicine | Admitting: Emergency Medicine

## 2017-03-30 DIAGNOSIS — Z5189 Encounter for other specified aftercare: Secondary | ICD-10-CM

## 2017-03-30 DIAGNOSIS — F17210 Nicotine dependence, cigarettes, uncomplicated: Secondary | ICD-10-CM | POA: Insufficient documentation

## 2017-03-30 DIAGNOSIS — W269XXD Contact with unspecified sharp object(s), subsequent encounter: Secondary | ICD-10-CM | POA: Insufficient documentation

## 2017-03-30 DIAGNOSIS — S0101XD Laceration without foreign body of scalp, subsequent encounter: Secondary | ICD-10-CM | POA: Insufficient documentation

## 2017-03-30 DIAGNOSIS — Z4802 Encounter for removal of sutures: Secondary | ICD-10-CM

## 2017-03-30 NOTE — ED Provider Notes (Signed)
[facility] Emergency Department Provider Note   First MD Initiated Contact with Patient 03/30/17 0147     (approximate)  I have reviewed the triage vital signs and the nursing notes.   HISTORY  Chief Complaint Wound Check   HPI Dylan [name]. is a 41 y.o. male presents to the emergency department for staple removal from the scalp. Patient was seen on 03/24/2017 secondary to a laceration that was sustained to the scalp when he accidentally leaned into a piece of metal.   Past Medical History:  Diagnosis Date  . Hyperlipemia     There are no active problems to display for this patient.   Past Surgical History:  Procedure Laterality Date  . ELBOW SURGERY Right     Prior to Admission medications   Medication Sig Start Date End Date Taking? Authorizing Provider  amoxicillin (AMOXIL) 500 MG capsule Take 1 capsule (500 mg total) by mouth 3 (three) times daily. 12/19/16   Irean Hong, MD  HYDROcodone-acetaminophen (NORCO) 5-325 MG tablet Take 1 tablet by mouth every 6 (six) hours as needed for moderate pain. 12/19/16   Irean Hong, MD  ibuprofen (ADVIL,MOTRIN) 800 MG tablet Take 1 tablet (800 mg total) by mouth every 8 (eight) hours as needed for moderate pain. 12/19/16   Irean Hong, MD    Allergies Antihistamines, diphenhydramine-type; Prolixin [fluphenazine]; and Risperidone and related  No family history on file.  Social History Social History  Substance Use Topics  . Smoking status: Current Every Day Smoker    Packs/day: 1.00    Years: 20.00    Types: Cigarettes  . Smokeless tobacco: Never Used  . Alcohol use Yes     Comment: occassional use socially    Review of Systems Constitutional: No fever/chills Eyes: No visual changes. ENT: No sore throat. Cardiovascular: Denies chest pain. Respiratory: Denies shortness of breath. Gastrointestinal: No abdominal pain.  No nausea, no vomiting.  No diarrhea.  No constipation. Genitourinary:  Negative for dysuria. Musculoskeletal: Negative for neck pain.  Negative for back pain. Integumentary: Negative for rash. Neurological: Negative for headaches, focal weakness or numbness.   ____________________________________________   PHYSICAL EXAM:  VITAL SIGNS: ED Triage Vitals  Enc Vitals Group     BP 03/30/17 0134 113/75     Pulse Rate 03/30/17 0134 95     Resp 03/30/17 0134 18     Temp 03/30/17 0134 98.1 F (36.7 C)     Temp Source 03/30/17 0134 Oral     SpO2 03/30/17 0134 97 %     Weight 03/30/17 0131 99.8 kg (220 lb)     Height 03/30/17 0131 1.88 m ( )     Head Circumference --      Peak Flow --      Pain Score --      Pain Loc --      Pain Edu? --      Excl. in GC? --     Constitutional: Alert and oriented. Well appearing and in no acute distress. Eyes: Conjunctivae are normal.  Head: 5.5 cm linear laceration to the right frontal scalp healing no active signs of infection. Mouth/Throat: Mucous membranes are moist.  Oropharynx non-erythematous. Neck: No stridor.  Skin:  Skin is warm, dry and intact. No rash noted.5.5 cm linear laceration to the right frontal scalp healing no active signs of infection.   _________  .Suture Removal Date/Time: 03/30/2017 2:07 AM Performed by: Darci Current Authorized by: Manson Passey,  [place]   Consent:    Consent obtained:  Verbal   Consent given by:  Patient   Alternatives discussed:  No treatment Location:    Location:  Head/neck   Head/neck location:  Scalp Procedure details:    Wound appearance:  No signs of infection   Number of staples removed:  6 Post-procedure details:    Patient tolerance of procedure:  Tolerated well, no immediate complications     ____________________________________________   INITIAL IMPRESSION / ASSESSMENT AND PLAN / ED COURSE  Pertinent labs & imaging results that were available during my care of the patient were reviewed by me and considered in my medical decision making (see  chart for details).        ____________________________________________  FINAL CLINICAL IMPRESSION(S) / ED DIAGNOSES  Final diagnoses:  Visit for wound check  Encounter for removal of staples     MEDICATIONS GIVEN DURING THIS VISIT:  Medications - No data to display   NEW OUTPATIENT MEDICATIONS STARTED DURING THIS VISIT:  New Prescriptions   No medications on file    Modified Medications   No medications on file    Discontinued Medications   No medications on file     Note:  This document was prepared using Dragon voice recognition software and may include unintentional dictation errors.    Darci Current, MD 03/30/17 [phone]

## 2017-03-30 NOTE — ED Notes (Signed)
Six staples removed without incident. Patient denies pain. MD in to exam patient and will discharge accordingly.

## 2017-03-30 NOTE — ED Notes (Signed)
Patient presents to the ED for staple removal from laceration repair. Laceration is well healed with scabbing noted. Six staples remain. Denies fever at home, no S/Sx of infection.

## 2017-03-30 NOTE — ED Triage Notes (Signed)
Patient ambulatory to triage with steady gait, without difficulty or distress noted; pt seen Wednesday for staples to head wound; here for removal; denies any c/o

## 2017-09-01 ENCOUNTER — Emergency Department
Admission: EM | Admit: 2017-09-01 | Discharge: 2017-09-01 | Disposition: A | Discharge status: Home / Self Care | Payer: Medicare Other | Attending: Emergency Medicine | Admitting: Emergency Medicine

## 2017-09-01 ENCOUNTER — Encounter: Payer: Self-pay | Admitting: Emergency Medicine

## 2017-09-01 DIAGNOSIS — G4700 Insomnia, unspecified: Secondary | ICD-10-CM | POA: Insufficient documentation

## 2017-09-01 DIAGNOSIS — F5101 Primary insomnia: Secondary | ICD-10-CM

## 2017-09-01 DIAGNOSIS — F21 Schizotypal disorder: Secondary | ICD-10-CM

## 2017-09-01 DIAGNOSIS — F17210 Nicotine dependence, cigarettes, uncomplicated: Secondary | ICD-10-CM | POA: Insufficient documentation

## 2017-09-01 DIAGNOSIS — F909 Attention-deficit hyperactivity disorder, unspecified type: Secondary | ICD-10-CM

## 2017-09-01 HISTORY — DX: Attention-deficit hyperactivity disorder, unspecified type: F90.9

## 2017-09-01 MED ORDER — BENZTROPINE MESYLATE 1 MG/ML IJ SOLN
0.5000 mg | Freq: Once | INTRAMUSCULAR | Status: AC
  Administered 2017-09-01: 0.5 mg via INTRAMUSCULAR
  Filled 2017-09-01: qty 0.5

## 2017-09-01 MED ORDER — HALOPERIDOL LACTATE 5 MG/ML IJ SOLN
5.0000 mg | Freq: Once | INTRAMUSCULAR | Status: AC
  Administered 2017-09-01: 5 mg via INTRAMUSCULAR
  Filled 2017-09-01: qty 1

## 2017-09-01 NOTE — ED Triage Notes (Signed)
Pt comes into the ED via POV c/o his need for his haldol injection.  Patient states he receives this injection every once in a while when he is struggling with his insomnia. Patient states he last slept on Monday.  Patient has transportation present with him.  Patient in NAD at this time and does not want to see the psychiatrist for any other issues and denies any SI or HI.

## 2017-09-01 NOTE — Consult Note (Signed)
[facility] Face-to-Face Psychiatry Consult   Reason for Consult: Consult for 42 year old man with a past history of mental health problems who came voluntarily requesting assistance with insomnia Referring Physician: Pershing Proud Patient Identification: Dylan [name]. MRN:  [id number] Principal Diagnosis: Insomnia Diagnosis:   Patient Active Problem List   Diagnosis Date Noted  . Insomnia [G47.00] 09/01/2017  . ADHD [F90.9] 09/01/2017  . 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 is a 42 y.o. male patient admitted with "I just could not sleep.  It happens sometimes".  HPI: Patient interviewed chart reviewed.  Patient known from previous encounters.  This is a 42 year old man who has a history of mental health concerns in the past who comes into the emergency room voluntarily today requesting assistance with insomnia.  He says that he is working at [employer] where he usually ends his shift around 1 AM.  Typically he will fall asleep sometime between 4 and 6 AM and then sleep until the early afternoon.  Today he came home from his shift but has been unable to fall asleep.  He is requesting specifically Haldol in an injectable form to help him sleep today.  Patient denies psychotic symptoms.  Denies hallucinations.  Denies mood symptoms.  Not feeling depressed not having mood swings.  Denies paranoia or any thought disorder.  He is calm neatly groomed very appropriate in his interactions.  He says that he has stopped abusing cough syrup and is not using any other drugs.  He is not at the moment taking any medications although he does say that he went to see his primary care doctor recently to see if he would restart his Dexedrine for his ADHD.  The patient claims he last used any of it on Saturday.  Medical history: No significant ongoing medical problems.  Social history: Currently working full-time at a [employer].  He is living with a woman.  He says that they are planning to get married.  He feels very pleased and proud about how his life is currently going.  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  Past Psychiatric History: Patient in the past had been a frequent visitor to the emergency room with what appeared to be psychotic symptoms.  He had a diagnosis of schizophrenia or schizoaffective disorder although I know there had long been a certain school of thought among some providers that the problem was really more personality and substance abuse.  He used to have a habit of acting bizarrely in public that would end up getting him brought into the hospital however he does not have any known history of serious violence.  He has not been getting any psychiatric treatment recently.  Not currently on any psychiatric medicine.  He has had several visits to the ER in the last couple years without requiring psychiatric hospitalization.  Patient believes he has ADHD and he may have gotten diagnosed with that in the past.  I am slightly skeptical as to how well he would be able to use amphetamines.  Risk to Self: Is patient at risk for suicide?: No Risk to Others:   Prior Inpatient Therapy:   Prior  Outpatient Therapy:    Past Medical History:  Past Medical History:  Diagnosis Date  . ADHD   . Hyperlipemia     Past Surgical History:  Procedure Laterality Date  . ELBOW SURGERY Right    Family History: No family history on file. Family Psychiatric  History: Denies any Social History:  Social History   Substance and Sexual Activity  Alcohol Use Yes   Comment: occassional use socially     Social History   Substance and Sexual Activity  Drug Use No    Social  History   Socioeconomic History  . Marital status: Single    Spouse name: None  . Number of children: None  . Years of education: None  . Highest education level: None  Social Needs  . Financial resource strain: None  . Food insecurity - worry: None  . Food insecurity - inability: None  . Transportation needs - medical: None  . Transportation needs - non-medical: None  Occupational History  . None  Tobacco Use  . Smoking status: Current Every Day Smoker    Packs/day: 1.00    Years: 20.00    Pack years: 20.00    Types: Cigarettes  . Smokeless tobacco: Never Used  Substance and Sexual Activity  . Alcohol use: Yes    Comment: occassional use socially  . Drug use: No  . Sexual activity: None  Other Topics Concern  . None  Social History Narrative  . None   Additional Social History:    Allergies:   Allergies  Allergen Reactions  . Antihistamines, Diphenhydramine-Type Other (See Comments)    Seizures; pt states he can tolerate very low doses (10mg )  . Prolixin [Fluphenazine] Photosensitivity  . Risperidone And Related Other (See Comments)    seizures    Labs: No results found for this or any previous visit (from the past 48 hour(s)).  Current Facility-Administered Medications  Medication Dose Route Frequency Provider Last Rate Last Dose  . benztropine mesylate (COGENTIN) injection 0.5 mg  0.5 mg Intramuscular Once Greysen Devino T, MD      . haloperidol lactate (HALDOL) injection 5 mg  5 mg Intramuscular Once Hartleigh Edmonston, Jackquline Denmark, MD       Current Outpatient Medications  Medication Sig Dispense Refill  . amoxicillin (AMOXIL) 500 MG capsule Take 1 capsule (500 mg total) by mouth 3 (three) times daily. 21 capsule 0  . HYDROcodone-acetaminophen (NORCO) 5-325 MG tablet Take 1 tablet by mouth every 6 (six) hours as needed for moderate pain. 15 tablet 0  . ibuprofen (ADVIL,MOTRIN) 800 MG tablet Take 1 tablet (800 mg total) by mouth every 8 (eight) hours as needed for moderate  pain. 15 tablet 0    Musculoskeletal: Strength & Muscle Tone: within normal limits Gait & Station: normal Patient leans: N/A  Psychiatric Specialty Exam: Physical Exam  Nursing note and vitals reviewed. Constitutional: He appears well-developed and well-nourished.  HENT:  Head: Normocephalic and atraumatic.  Eyes: Conjunctivae are normal. Pupils are equal, round, and reactive to light.  Neck: Normal range of motion.  Cardiovascular: Regular rhythm and normal heart sounds.  Respiratory: Effort normal. No respiratory distress.  GI: Soft.  Musculoskeletal: Normal range of motion.  Neurological: He is alert.  Skin: Skin is warm and dry.  Psychiatric: He has a normal mood and affect. His speech is normal and behavior is normal. Judgment and thought content normal. Cognition and memory are normal.    Review of Systems  Constitutional: Negative.   HENT: Negative.  Eyes: Negative.   Respiratory: Negative.   Cardiovascular: Negative.   Gastrointestinal: Negative.   Musculoskeletal: Negative.   Skin: Negative.   Neurological: Negative.   Psychiatric/Behavioral: Negative for depression, hallucinations, memory loss, substance abuse and suicidal ideas. The patient has insomnia. The patient is not nervous/anxious.     Blood pressure 121/85, pulse (!) 103, temperature 97.8 F (36.6 C), temperature source Oral, resp. rate 18, height 6\' 2"  (1.88 m), weight 103.4 kg (228 lb), SpO2 98 %.Body mass index is 29.27 kg/m.  General Appearance: Fairly Groomed  Eye Contact:  Good  Speech:  Clear and Coherent  Volume:  Normal  Mood:  Euthymic  Affect:  Congruent  Thought Process:  Goal Directed  Orientation:  Full (Time, Place, and Person)  Thought Content:  Logical  Suicidal Thoughts:  No  Homicidal Thoughts:  No  Memory:  Immediate;   Fair Recent;   Fair Remote;   Fair  Judgement:  Fair  Insight:  Fair  Psychomotor Activity:  Normal  Concentration:  Concentration: Fair  Recall:  Eastman KodakFair   Fund of Knowledge:  Fair  Language:  Fair  Akathisia:  No  Handed:  Right  AIMS (if indicated):     Assets:  Desire for Improvement Housing Physical Health Resilience  ADL's:  Intact  Cognition:  WNL  Sleep:        Treatment Plan Summary: Medication management and Plan 42 year old man with a past history of psychotic symptoms who presents today calm and without psychosis.  He is complaining of insomnia and requesting a shot of Haldol to help him sleep.  There is no indication of any dangerousness or even acute mental illness symptoms that would warrant hospitalization or petition.  The patient's request seems benign and reasonable enough and we know he has tolerated Haldol in the past.  He is well aware of the side effects.  I would mention that I am slightly concerned about his use of amphetamines.  I confirmed on the controlled substance database that he did get a prescription for long-acting Dexedrine last week 15 mg twice a day.  He tells me that he only took it a couple days and has had none since Saturday.  Obviously if he is now having insomnia I am concerned that may be he has had a little more trouble with that.  Nevertheless there is no evidence that he has gotten anything illegally and he is saying that he thinks he will stop taking the Dexedrine.  Diagnosis will just be insomnia with a history of schizotypal personality.  I ordered 5 mg of Haldol IM and a half of a milligram of Cogentin IM to mitigate any side effects.  Case reviewed with ER physician.  Patient can be discharged from the emergency room.  He is not currently presenting with symptoms that would require ongoing psychiatric treatment but he is aware of how to access them if he does.  Disposition: No evidence of imminent risk to self or others at present.   Patient does not meet criteria for psychiatric inpatient admission. Supportive therapy provided about ongoing stressors.  Mordecai RasmussenJohn Yoshimi Sarr, MD 09/01/2017 1:23 PM

## 2017-09-01 NOTE — ED Notes (Signed)
Pt a/o without complaints except insomnia. Requesting haldol for sleep. Dr Pershing Proudschaevitz at bedside.

## 2017-09-01 NOTE — ED Provider Notes (Signed)
[facility] Emergency Department Provider Note  ____________________________________________   First MD Initiated Contact with Patient 09/01/17 1226     (approximate)  I have reviewed the triage vital signs and the nursing notes.   HISTORY  Chief Complaint Injections   HPI Dylan Proby. is a 42 y.o. male with a history of ADHD as well as schizophrenia who is presenting to the emergency department today with inability to sleep over the past several days.  He denies racing thoughts, suicide ideation, homicidal ideation, hallucinations.  Denies any drinking or drug use.  Is requesting a Haldol injection which he says he used to get for similar symptoms.  Past Medical History:  Diagnosis Date  . ADHD   . Hyperlipemia     There are no active problems to display for this patient.   Past Surgical History:  Procedure Laterality Date  . ELBOW SURGERY Right     Prior to Admission medications   Medication Sig Start Date End Date Taking? Authorizing Provider  amoxicillin (AMOXIL) 500 MG capsule Take 1 capsule (500 mg total) by mouth 3 (three) times daily. 12/19/16   Irean Hong, MD  HYDROcodone-acetaminophen (NORCO) 5-325 MG tablet Take 1 tablet by mouth every 6 (six) hours as needed for moderate pain. 12/19/16   Irean Hong, MD  ibuprofen (ADVIL,MOTRIN) 800 MG tablet Take 1 tablet (800 mg total) by mouth every 8 (eight) hours as needed for moderate pain. 12/19/16   Irean Hong, MD    Allergies Antihistamines, diphenhydramine-type; Prolixin [fluphenazine]; and Risperidone and related  No family history on file.  Social History Social History   Tobacco Use  . Smoking status: Current Every Day Smoker    Packs/day: 1.00    Years: 20.00    Pack years: 20.00    Types: Cigarettes  . Smokeless tobacco: Never Used  Substance Use Topics  . Alcohol use: Yes    Comment: occassional use socially  . Drug use: No    Review of Systems  Constitutional:  No fever/chills Eyes: No visual changes. ENT: No sore throat. Cardiovascular: Denies chest pain. Respiratory: Denies shortness of breath. Gastrointestinal: No abdominal pain.  No nausea, no vomiting.  No diarrhea.  No constipation. Genitourinary: Negative for dysuria. Musculoskeletal: Negative for back pain. Skin: Negative for rash. Neurological: Negative for headaches, focal weakness or numbness.   ____________________________________________   PHYSICAL EXAM:  VITAL SIGNS: ED Triage Vitals  Enc Vitals Group     BP 09/01/17 1215 121/85     Pulse Rate 09/01/17 1215 (!) 103     Resp 09/01/17 1215 18     Temp 09/01/17 1215 97.8 F (36.6 C)     Temp Source 09/01/17 1215 Oral     SpO2 09/01/17 1215 98 %     Weight 09/01/17 1216 228 lb (103.4 kg)     Height 09/01/17 1216 6\' 2"  (1.88 m)     Head Circumference --      Peak Flow --      Pain Score --      Pain Loc --      Pain Edu? --      Excl. in GC? --     Constitutional: Alert and oriented. Well appearing and in no acute distress. Eyes: Conjunctivae are normal.  Head: Atraumatic. Nose: No congestion/rhinnorhea. Mouth/Throat: Mucous membranes are moist.  Neck: No stridor.   Cardiovascular: Normal rate, regular rhythm. Grossly normal heart sounds.   Respiratory: Normal respiratory effort.  No retractions. Lungs CTAB. Gastrointestinal: Soft and nontender. No distention.  Musculoskeletal: No lower extremity tenderness nor edema.  No joint effusions. Neurologic:  Normal speech and language. No gross focal neurologic deficits are appreciated. Skin:  Skin is warm, dry and intact. No rash noted. Psychiatric: Mood and affect are normal. Speech and behavior are normal.  ____________________________________________   LABS (all labs ordered are listed, but only abnormal results are displayed)  Labs Reviewed - No data to  display ____________________________________________  EKG   ____________________________________________  RADIOLOGY   ____________________________________________   PROCEDURES  Procedure(s) performed:   Procedures  Critical Care performed:   ____________________________________________   INITIAL IMPRESSION / ASSESSMENT AND PLAN / ED COURSE  Pertinent labs & imaging results that were available during my care of the patient were reviewed by me and considered in my medical decision making (see chart for details).  DDX: Psychosis, mania, insomnia, ADHD, depression As part of my medical decision making, I reviewed the following data within the electronic MEDICAL RECORD NUMBER Notes from prior ED visits  ----------------------------------------- 12:43 PM on 09/01/2017 -----------------------------------------  Discussed case with Dr. Toni Amendlapacs who knows the patient and says that he will see the patient is a consult.   ----------------------------------------- 1:19 PM on 09/01/2017 -----------------------------------------  Patient seen and evaluated by Dr. Toni Amendlapacs.  Patient did receive Haldol injection prior to discharge.  He will be given follow-up information for [facility].  ____________________________________________   FINAL CLINICAL IMPRESSION(S) / ED DIAGNOSES  Insomnia    NEW MEDICATIONS STARTED DURING THIS VISIT:  New Prescriptions   No medications on file     Note:  This document was prepared using Dragon voice recognition software and may include unintentional dictation errors.     Myrna BlazerSchaevitz, David Matthew, MD 09/01/17 [phone]

## 2017-10-13 ENCOUNTER — Encounter: Payer: Self-pay | Admitting: Emergency Medicine

## 2017-10-13 ENCOUNTER — Other Ambulatory Visit: Payer: Self-pay

## 2017-10-13 ENCOUNTER — Emergency Department: Payer: Self-pay

## 2017-10-13 ENCOUNTER — Emergency Department
Admission: EM | Admit: 2017-10-13 | Discharge: 2017-10-13 | Disposition: A | Discharge status: Home / Self Care | Payer: Self-pay | Attending: Emergency Medicine | Admitting: Emergency Medicine

## 2017-10-13 DIAGNOSIS — W010XXA Fall on same level from slipping, tripping and stumbling without subsequent striking against object, initial encounter: Secondary | ICD-10-CM | POA: Insufficient documentation

## 2017-10-13 DIAGNOSIS — Y9389 Activity, other specified: Secondary | ICD-10-CM | POA: Insufficient documentation

## 2017-10-13 DIAGNOSIS — F909 Attention-deficit hyperactivity disorder, unspecified type: Secondary | ICD-10-CM | POA: Insufficient documentation

## 2017-10-13 DIAGNOSIS — S5001XA Contusion of right elbow, initial encounter: Secondary | ICD-10-CM | POA: Insufficient documentation

## 2017-10-13 DIAGNOSIS — Y929 Unspecified place or not applicable: Secondary | ICD-10-CM | POA: Insufficient documentation

## 2017-10-13 DIAGNOSIS — Y998 Other external cause status: Secondary | ICD-10-CM | POA: Insufficient documentation

## 2017-10-13 DIAGNOSIS — F17210 Nicotine dependence, cigarettes, uncomplicated: Secondary | ICD-10-CM | POA: Insufficient documentation

## 2017-10-13 MED ORDER — NAPROXEN 500 MG PO TABS: 500.0000 mg | ORAL_TABLET | Freq: Two times a day (BID) | ORAL | 0 refills | Status: DC

## 2017-10-13 MED ORDER — NAPROXEN 500 MG PO TABS
500.0000 mg | ORAL_TABLET | Freq: Once | ORAL | Status: DC
  Filled 2017-10-13: qty 1

## 2017-10-13 NOTE — ED Triage Notes (Signed)
Pt here with c/o right arm injury, states he "fell over like a drunk would" landing on his right arm, pain in right elbow, has ROM however not full. Has had surgery to right elbow, pt states it doesn't feel broken, possibly sprained. In NAD at this time.

## 2017-10-13 NOTE — ED Notes (Signed)
Pt ambulatory to POV. VSS. NAD. Pt refused to sign discharge instructions at this time. Discharge instructions, RX and follow up discussed.

## 2017-10-13 NOTE — ED Provider Notes (Signed)
[facility] Emergency Department Provider Note   ____________________________________________   First MD Initiated Contact with Patient 10/13/17 1217     (approximate)  I have reviewed the triage vital signs and the nursing notes.   HISTORY  Chief Complaint Arm Injury    HPI Dylan [name]. is a 42 y.o. male patient complain of right elbow pain secondary to a trip and fall.  Patient is internal fixation secondary to a previous fracture of the right elbow.  Patient states decreased range of motion with extension but denies loss of sensation.  Patient rates pain as a 2/10.  Patient described the pain is "dull".  Past Medical History:  Diagnosis Date  . ADHD   . Hyperlipemia     Patient Active Problem List   Diagnosis Date Noted  . Insomnia 09/01/2017  . ADHD 09/01/2017  . 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 ELBOW SURGERY Right     Prior to Admission medications   Medication Sig Start Date End Date Taking? Authorizing Provider  amoxicillin (AMOXIL) 500 MG capsule Take 1 capsule (500 mg total) by mouth 3 (three) times daily. 12/19/16   Irean Hong, MD  HYDROcodone-acetaminophen (NORCO) 5-325 MG tablet Take 1 tablet by mouth every 6 (six) hours as needed for moderate pain. 12/19/16   Irean Hong, MD  ibuprofen (ADVIL,MOTRIN) 800 MG tablet Take 1 tablet (800 mg total) by mouth every 8 (eight) hours as needed for moderate pain. 12/19/16   Irean Hong, MD  naproxen (NAPROSYN) 500 MG tablet Take 1 tablet (500 mg total) by mouth 2 (two) times daily with a meal. 10/13/17   Joni Reining, PA-C    Allergies Antihistamines, diphenhydramine-type; Prolixin [fluphenazine]; and Risperidone and related  No family history on file.  Social History Social History   Tobacco Use  . Smoking status: Current Every Day Smoker    Packs/day: 1.00    Years: 20.00    Pack years: 20.00   Types: Cigarettes  . Smokeless tobacco: Never Used  Substance Use Topics  . Alcohol use: Yes    Comment: occassional use socially  . 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 ENT: No sore throat. Cardiovascular: Denies chest pain. Respiratory: Denies shortness of breath. Gastrointestinal: No abdominal pain.  No nausea, no vomiting.  No diarrhea.  No constipation. Genitourinary: Negative for dysuria. Musculoskeletal: Right elbow pain. Skin: Negative for rash. Neurological: Negative for headaches, focal weakness or numbness. Psychiatric:ADHD and also Schizotypical personality disorder.   Allergic/Immunilogical: See medication list ____________________________________________   PHYSICAL EXAM:  VITAL SIGNS: ED Triage Vitals [10/13/17 1205]  Enc Vitals Group     BP 140/79     Pulse Rate 92     Resp 18     Temp 98 F (36.7 C)     Temp Source Oral     SpO2 98 %     Weight 227 lb (103 kg)     Height 6\' 2"  (1.88 m)     Head Circumference      Peak Flow      Pain Score 2     Pain Loc      Pain Edu?      Excl. in GC?    Constitutional: Alert and oriented. Well appearing and in no acute distress. rhythm. Grossly normal  heart sounds.  Good peripheral circulation. Respiratory: Normal respiratory effort.  No retractions. Lungs CTAB. Gastrointestinal: Soft and nontender. No distention. No abdominal bruits. No CVA tenderness. Musculoskeletal: No obvious deformity of the right elbow.  Patient holds the arm in flexion.. Neurologic:  Normal speech and language. No gross focal neurologic deficits are appreciated. No gait instability. Skin:  Skin is warm, dry and intact. No rash noted.  No edema erythema or ecchymosis. Psychiatric: Mood and affect are normal. Speech and behavior are normal.  ____________________________________________   LABS (all labs ordered are listed, but only abnormal  results are displayed)  Labs Reviewed - No data to display ____________________________________________  EKG   ____________________________________________  RADIOLOGY  ED MD interpretation:    Official radiology report(s): Dg Elbow Complete Right  Result Date: 10/13/2017 CLINICAL DATA:  Decreased range of motion and pain secondary to a fall EXAM: RIGHT ELBOW - COMPLETE 3+ VIEW COMPARISON:  11/17/2014 FINDINGS: Two plates and multiple screws are seen at a deformed distal RIGHT humerus post ORIF of the comminuted displaced fracture seen on the previous exam. Two screws are present in the proximal ulna. Joint spaces preserved. Soft tissue calcification versus nonunion of an old fracture fragment anterior to the distal humerus on the lateral view. No acute fracture, dislocation, or bone destruction. IMPRESSION: Extensive posttraumatic and postsurgical deformities of the distal humerus with evidence of prior olecranon ORIF as well. No acute bony findings. Non fused fracture fragment versus soft tissue calcification anterior to the distal humerus. Electronically Signed   By: Ulyses SouthwardMark  Boles M.D.   On: 10/13/2017 12:58    ____________________________________________   PROCEDURES  Procedure(s) performed: None  Procedures  Critical Care performed: No  ____________________________________________   INITIAL IMPRESSION / ASSESSMENT AND PLAN / ED COURSE  As part of my medical decision making, I reviewed the following data within the electronic MEDICAL RECORD NUMBER    Patient presents with right elbow pain secondary to fall.  Patient has a history of internal fixation from previous surgery.  X-ray finding by radiology shows no new acute fractures.  Internal fixation appears to be stable.  Discussed x-ray findings with patient.  Patient placed in arm sling for comfort.  Patient given prescription for naproxen.  Patient advised follow-up with treating orthopedics as needed.       ____________________________________________   FINAL CLINICAL IMPRESSION(S) / ED DIAGNOSES  Final diagnoses:  Contusion of right elbow, initial encounter     ED Discharge Orders        Ordered    naproxen (NAPROSYN) 500 MG tablet  2 times daily with meals     10/13/17 1313       Note:  This document was prepared using Dragon voice recognition software and may include unintentional dictation errors.    Joni ReiningSmith, Dylan K, PA-C 10/13/17 1326    Schaevitz, Myra Rudeavid Matthew, MD 10/13/17 [phone]

## 2017-10-13 NOTE — ED Notes (Signed)
See triage note  Presents with pain to right arm  Denies any fall  But states he may have passed out and laid on arm for extended period  Pain is mainly at elbow  No swelling noted  Good pulses

## 2017-10-13 NOTE — Discharge Instructions (Addendum)
Arm sling for 3-5 days as needed. °

## 2017-10-14 ENCOUNTER — Emergency Department
Admission: EM | Admit: 2017-10-14 | Discharge: 2017-10-14 | Disposition: A | Discharge status: Home / Self Care | Payer: Self-pay | Attending: Emergency Medicine | Admitting: Emergency Medicine

## 2017-10-14 ENCOUNTER — Encounter: Payer: Self-pay | Admitting: Emergency Medicine

## 2017-10-14 ENCOUNTER — Emergency Department: Payer: Self-pay

## 2017-10-14 DIAGNOSIS — Z79899 Other long term (current) drug therapy: Secondary | ICD-10-CM | POA: Insufficient documentation

## 2017-10-14 DIAGNOSIS — F909 Attention-deficit hyperactivity disorder, unspecified type: Secondary | ICD-10-CM | POA: Insufficient documentation

## 2017-10-14 DIAGNOSIS — T5391XA Toxic effect of unspecified halogen derivatives of aliphatic and aromatic hydrocarbons, accidental (unintentional), initial encounter: Secondary | ICD-10-CM | POA: Insufficient documentation

## 2017-10-14 DIAGNOSIS — Z5321 Procedure and treatment not carried out due to patient leaving prior to being seen by health care provider: Secondary | ICD-10-CM | POA: Insufficient documentation

## 2017-10-14 DIAGNOSIS — F17210 Nicotine dependence, cigarettes, uncomplicated: Secondary | ICD-10-CM | POA: Insufficient documentation

## 2017-10-14 DIAGNOSIS — G4700 Insomnia, unspecified: Secondary | ICD-10-CM | POA: Insufficient documentation

## 2017-10-14 DIAGNOSIS — T59894A Toxic effect of other specified gases, fumes and vapors, undetermined, initial encounter: Secondary | ICD-10-CM

## 2017-10-14 LAB — CBC
HCT: 43 % (ref 40.0–52.0)
Hemoglobin: 14.9 g/dL (ref 13.0–18.0)
MCH: 31.6 pg (ref 26.0–34.0)
MCHC: 34.6 g/dL (ref 32.0–36.0)
MCV: 91.2 fL (ref 80.0–100.0)
PLATELETS: 198 10*3/uL (ref 150–440)
RBC: 4.72 MIL/uL (ref 4.40–5.90)
RDW: 13.5 % (ref 11.5–14.5)
WBC: 10.8 10*3/uL — ABNORMAL HIGH (ref 3.8–10.6)

## 2017-10-14 LAB — COMPREHENSIVE METABOLIC PANEL
ALBUMIN: 4.1 g/dL (ref 3.5–5.0)
ALT: 39 U/L (ref 17–63)
AST: 62 U/L — AB (ref 15–41)
Alkaline Phosphatase: 76 U/L (ref 38–126)
Anion gap: 5 (ref 5–15)
BUN: 10 mg/dL (ref 6–20)
CHLORIDE: 111 mmol/L (ref 101–111)
CO2: 21 mmol/L — ABNORMAL LOW (ref 22–32)
Calcium: 8.8 mg/dL — ABNORMAL LOW (ref 8.9–10.3)
Creatinine, Ser: 0.96 mg/dL (ref 0.61–1.24)
GFR calc Af Amer: >60 mL/min (ref >60)
GFR calc non Af Amer: >60 mL/min (ref >60)
GLUCOSE: 100 mg/dL — AB (ref 65–99)
POTASSIUM: 3.7 mmol/L (ref 3.5–5.1)
SODIUM: 137 mmol/L (ref 135–145)
Total Bilirubin: 0.5 mg/dL (ref 0.3–1.2)
Total Protein: 7.3 g/dL (ref 6.5–8.1)

## 2017-10-14 LAB — URINE DRUG SCREEN, QUALITATIVE (ARMC ONLY)
Amphetamines, Ur Screen: NOT DETECTED
Barbiturates, Ur Screen: NOT DETECTED
Benzodiazepine, Ur Scrn: NOT DETECTED
CANNABINOID 50 NG, UR ~~LOC~~: NOT DETECTED
COCAINE METABOLITE, UR ~~LOC~~: NOT DETECTED
MDMA (ECSTASY) UR SCREEN: NOT DETECTED
Methadone Scn, Ur: NOT DETECTED
Opiate, Ur Screen: NOT DETECTED
PHENCYCLIDINE (PCP) UR S: NOT DETECTED
TRICYCLIC, UR SCREEN: NOT DETECTED

## 2017-10-14 LAB — COOXEMETRY PANEL
CARBOXYHEMOGLOBIN: 2.2 % — AB (ref 0.5–1.5)
METHEMOGLOBIN: 0.2 % (ref 0.0–1.5)
O2 SAT: 49.3 %
Total oxygen content: 71 mL/dL

## 2017-10-14 LAB — ETHANOL: Alcohol, Ethyl (B): <10 mg/dL (ref <10)

## 2017-10-14 LAB — BLOOD GAS, VENOUS
ACID-BASE DEFICIT: 3.8 mmol/L — AB (ref 0.0–2.0)
BICARBONATE: 21.5 mmol/L (ref 20.0–28.0)
O2 Saturation: 74.5 %
PH VEN: 7.35 (ref 7.250–7.430)
Patient temperature: 37
pCO2, Ven: 39 mmHg — ABNORMAL LOW (ref 44.0–60.0)
pO2, Ven: 42 mmHg (ref 32.0–45.0)

## 2017-10-14 MED ORDER — LORAZEPAM 2 MG/ML IJ SOLN
1.0000 mg | Freq: Once | INTRAMUSCULAR | Status: AC
  Administered 2017-10-14: 1 mg via INTRAVENOUS
  Filled 2017-10-14: qty 1

## 2017-10-14 NOTE — ED Notes (Signed)
Selena BattenKim, RN updated as to patient care.

## 2017-10-14 NOTE — ED Triage Notes (Signed)
Patient c/o insomnia. Patient reports he has only had 1 hours of sleep since Sunday. Patient reports that he normally gets a shot of haldol and another medication when this occurs. Patient denies HI, SI, and hallucinations.

## 2017-10-14 NOTE — Discharge Instructions (Signed)
Your lab tests improved with oxygen therapy.  You should avoid inhaling strong fumes from volatile chemicals, as this can cause damage to your lungs, heart, and brain.

## 2017-10-14 NOTE — ED Provider Notes (Signed)
[facility] Emergency Department Provider Note  ____________________________________________  Time seen: Approximately 4:18 PM  I have reviewed the triage vital signs and the nursing notes.   HISTORY  Chief Complaint Seizures   HPI Dylan Baloed A Havlin Jr. is a 42 y.o. male with a history of schizotypical personality disorder, ADHD, hyperlipidemia who presents for evaluation of body jerking episodes.  Patient reports that he has been huffing toluene paint for most of his life.  He helped to 3 gallons over the last 4 weeks.  Since yesterday evening he has been having these episodes where his entire body starts to jerk and he has no control over it.  Episodes last 2-3 seconds at a time.  No LOC.  No urinary or bowel loss.  He is able to speak during these episodes.  No postictal phase.  He denies any trauma.  He denies any other type of drug use.  He denies alcohol.  He is a smoker.  He reports that he has had close to 10 episodes since yesterday evening.  He always returns to baseline afterwards.  He has not done any inhalation of paint since yesterday.  Patient denies suicidal homicidal ideation.   Past Medical History:  Diagnosis Date  . ADHD   . Hyperlipemia     Patient Active Problem List   Diagnosis Date Noted  . Insomnia 09/01/2017  . ADHD 09/01/2017  . 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 ELBOW SURGERY Right     Prior to Admission medications   Medication Sig Start Date End Date Taking? Authorizing Provider  dextroamphetamine (DEXEDRINE SPANSULE) 15 MG 24 hr capsule Take 30 mg by mouth daily. 09/23/17  Yes [provider]  amoxicillin (AMOXIL) 500 MG capsule Take 1 capsule (500 mg total) by mouth 3 (three) times daily. Patient not taking: Reported on 10/14/2017 12/19/16   Irean HongSung, Jade J, MD  HYDROcodone-acetaminophen [facility](NORCO) 5-325 MG tablet Take 1 tablet by mouth every 6 (six)  hours as needed for moderate pain. Patient not taking: Reported on 10/14/2017 12/19/16   Irean HongSung, Jade J, MD  ibuprofen (ADVIL,MOTRIN) 800 MG tablet Take 1 tablet (800 mg total) by mouth every 8 (eight) hours as needed for moderate pain. Patient not taking: Reported on 10/14/2017 12/19/16   Irean HongSung, Jade J, MD  naproxen (NAPROSYN) 500 MG tablet Take 1 tablet (500 mg total) by mouth 2 (two) times daily with a meal. Patient not taking: Reported on 10/14/2017 10/13/17   Joni ReiningSmith, Ronald K, PA-C    Allergies Antihistamines, diphenhydramine-type; Prolixin [fluphenazine]; and Risperidone and related  History reviewed. No pertinent family history.  Social History Social History   Tobacco Use  . Smoking status: Current Every Day Smoker    Packs/day: 1.00    Years: 20.00    Pack years: 20.00    Types: Cigarettes  . Smokeless tobacco: Never Used  Substance Use Topics  . Alcohol use: Yes    Comment: occassional use socially  . 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 Eyes: Negative for visual changes. ENT: Negative for sore throat. Neck: No neck pain  Cardiovascular: Negative for chest pain. Respiratory: Negative for shortness of breath. Gastrointestinal: Negative for abdominal pain, vomiting or diarrhea. Genitourinary: Negative for dysuria. Musculoskeletal: Negative for back pain. Skin: Negative for rash. Neurological: Negative for headaches, weakness or numbness. Psych: No SI or  HI. + generalized body jerking movements  ____________________________________________   PHYSICAL EXAM:  VITAL SIGNS: ED Triage Vitals  Enc Vitals Group     BP 10/14/17 0955 119/86     Pulse Rate 10/14/17 0955 92     Resp 10/14/17 0955 17     Temp 10/14/17 0955 97.8 F (36.6 C)     Temp src --      SpO2 10/14/17 0955 97 %     Weight --      Height --      Head Circumference --      Peak Flow --      Pain Score 10/14/17 0956 0     Pain  Loc --      Pain Edu? --      Excl. in GC? --     Constitutional: Alert and oriented, patient had 2 episodes while I was in the room of jerking and contortion his entire body while alert. HEENT:      Head: Normocephalic and atraumatic.         Eyes: Conjunctivae are normal. Sclera is non-icteric.       Mouth/Throat: Mucous membranes are moist.       Neck: Supple with no signs of meningismus. Cardiovascular: Regular rate and rhythm. No murmurs, gallops, or rubs. 2+ symmetrical distal pulses are present in all extremities. No JVD. Respiratory: Normal respiratory effort. Lungs are clear to auscultation bilaterally. No wheezes, crackles, or rhonchi.  Gastrointestinal: Soft, non tender, and non distended with positive bowel sounds. No rebound or guarding. Musculoskeletal: Nontender with normal range of motion in all extremities. No edema, cyanosis, or erythema of extremities. Neurologic: Normal speech and language. A & O x3, PERRL, EOMI, no nystagmus, CN II-XII intact, motor testing reveals good tone and bulk throughout. There is no evidence of pronator drift or dysmetria. Muscle strength is 5/5 throughout. Deep tendon reflexes are 2+ throughout with downgoing toes. Sensory examination is intact. Gait is normal. Skin: Skin is warm, dry and intact. No rash noted. Psychiatric: Mood and affect are normal. Speech and behavior are normal.  ____________________________________________   LABS (all labs ordered are listed, but only abnormal results are displayed)  Labs Reviewed  CBC - Abnormal; Notable for the following components:      Result Value   WBC 10.8 (*)    All other components within normal limits  COMPREHENSIVE METABOLIC PANEL - Abnormal; Notable for the following components:   CO2 21 (*)    Glucose, Bld 100 (*)    Calcium 8.8 (*)    AST 62 (*)    All other components within normal limits  BLOOD GAS, VENOUS - Abnormal; Notable for the following components:   pCO2, Ven 39 (*)     Acid-base deficit 3.8 (*)    All other components within normal limits  COOXEMETRY PANEL - Abnormal; Notable for the following components:   Carboxyhemoglobin 4.8 (*)    All other components within normal limits  URINE DRUG SCREEN, QUALITATIVE ([facility] ONLY)  ETHANOL  METHEMOGLOBIN, BLOOD  COOXEMETRY PANEL  COOXEMETRY PANEL   ____________________________________________  EKG  ED ECG REPORT I, Nita Sickle, the attending physician, personally viewed and interpreted this ECG.  Normal sinus rhythm, rate of 85, normal intervals, right axis deviation, no ST elevations or depressions.  Unchanged from prior from 2015. ____________________________________________  RADIOLOGY  I have personally reviewed the images performed during this visit and I agree with the Radiologist's read.   Interpretation by Radiologist:  Dg  Chest 2 View  Result Date: 10/14/2017 CLINICAL DATA:  Seizure. EXAM: CHEST - 2 VIEW COMPARISON:  11/06/2012 FINDINGS: Low volume film. The lungs are clear without focal pneumonia, edema, pneumothorax or pleural effusion. Cardiopericardial silhouette is at upper limits of normal for size. The visualized bony structures of the thorax are intact. IMPRESSION: Stable.  Low volume film without acute cardiopulmonary findings. Electronically Signed   By: Kennith Center M.D.   On: 10/14/2017 11:35   ____________________________________________   PROCEDURES  Procedure(s) performed: None Procedures Critical Care performed:  None ____________________________________________   INITIAL IMPRESSION / ASSESSMENT AND PLAN / ED COURSE   43 y.o. male with a history of schizotypical personality disorder, ADHD, hyperlipidemia who presents for evaluation of body jerking episodes in the setting of huffing toluene paint daily for 4 weeks.  Patient is well-appearing, I have witnessed 2 of these episodes where he starts contortion and and jerking his body in the room.  He is awake and alert  during these episodes.  The last 2 seconds and resolved without any intervention.  His vitals are within normal limits, neurologically intact.  Labs show a slightly elevated carboxyhemoglobin of 4.8.  Patient was placed on 100% oxygen nonrebreather and will repeat the carboxyhemoglobin 2 hours.  Meth hemoglobin remains normal, VBG normal, drug screen was negative, all electrolytes are within normal limits. No AG metabolic acidosis.  Chest x-ray with no evidence of pneumonitis. Patient given IVF and ativan IV with resolution of these episodes.  Plan to repeat carboxyhemoglobin and if that is trending down patient be discharged home.  Discussed dangers of inhaling hydrocarbons.  Care has been transferred to Dr. Scotty Court.      As part of my medical decision making, I reviewed the following data within the electronic MEDICAL RECORD NUMBER Nursing notes reviewed and incorporated, Labs reviewed , EKG interpreted , Radiograph reviewed , Notes from prior ED visits and [place] Controlled Substance Database    Pertinent labs & imaging results that were available during my care of the patient were reviewed by me and considered in my medical decision making (see chart for details).    ____________________________________________   FINAL CLINICAL IMPRESSION(S) / ED DIAGNOSES  Final diagnoses:  Toxic effect of hydrocarbon gas, undetermined intent, initial encounter      NEW MEDICATIONS STARTED DURING THIS VISIT:  ED Discharge Orders    None       Note:  This document was prepared using Dragon voice recognition software and may include unintentional dictation errors.    Don Perking, [place], MD 10/14/17 [phone]

## 2017-10-14 NOTE — ED Triage Notes (Signed)
Pt to ED with c/o of seizure like activity. Per EMS pt admits to huffing 3 gals of paint thinner over 4 weeks. Per EMS pt states he is having seizures and that he is "awake" during them. Upon arrival pt states he has not slept since Sunday.

## 2017-10-14 NOTE — ED Notes (Addendum)
Pt placed on 15L via [facility] per MD order. Pt also given urinal to use the bathroom. MD notified patient appears diaphoretic on assessment.

## 2017-10-14 NOTE — ED Provider Notes (Signed)
vital signs remained normal. Patient is calm and comfortable without any furtherstable for discharge home. 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  Final diagnoses:  Toxic effect of hydrocarbon gas, undetermined intent, initial encounter      Sharman CheekStafford, Aliana Kreischer, MD 10/14/17 [phone]

## 2017-10-15 ENCOUNTER — Emergency Department
Admission: EM | Admit: 2017-10-15 | Discharge: 2017-10-15 | Disposition: A | Discharge status: Home / Self Care | Payer: Self-pay | Attending: Emergency Medicine | Admitting: Emergency Medicine

## 2017-10-15 ENCOUNTER — Emergency Department: Payer: Self-pay

## 2017-10-15 ENCOUNTER — Emergency Department
Admission: EM | Admit: 2017-10-15 | Discharge: 2017-10-15 | Discharge status: Absent without leave | Payer: Medicare Other | Source: Home / Self Care

## 2017-10-15 ENCOUNTER — Encounter: Payer: Self-pay | Admitting: Emergency Medicine

## 2017-10-15 DIAGNOSIS — R410 Disorientation, unspecified: Secondary | ICD-10-CM | POA: Insufficient documentation

## 2017-10-15 DIAGNOSIS — Z79899 Other long term (current) drug therapy: Secondary | ICD-10-CM | POA: Insufficient documentation

## 2017-10-15 DIAGNOSIS — M79601 Pain in right arm: Secondary | ICD-10-CM | POA: Insufficient documentation

## 2017-10-15 DIAGNOSIS — W19XXXD Unspecified fall, subsequent encounter: Secondary | ICD-10-CM | POA: Insufficient documentation

## 2017-10-15 DIAGNOSIS — F17210 Nicotine dependence, cigarettes, uncomplicated: Secondary | ICD-10-CM | POA: Insufficient documentation

## 2017-10-15 HISTORY — DX: Schizophrenia, unspecified: F20.9

## 2017-10-15 LAB — CBC
HCT: 43.7 % (ref 40.0–52.0)
Hemoglobin: 14.9 g/dL (ref 13.0–18.0)
MCH: 31.1 pg (ref 26.0–34.0)
MCHC: 34.2 g/dL (ref 32.0–36.0)
MCV: 90.7 fL (ref 80.0–100.0)
PLATELETS: 224 10*3/uL (ref 150–440)
RBC: 4.81 MIL/uL (ref 4.40–5.90)
RDW: 13.4 % (ref 11.5–14.5)
WBC: 8.1 10*3/uL (ref 3.8–10.6)

## 2017-10-15 LAB — COMPREHENSIVE METABOLIC PANEL
ALBUMIN: 4.2 g/dL (ref 3.5–5.0)
ALT: 38 U/L (ref 17–63)
AST: 47 U/L — AB (ref 15–41)
Alkaline Phosphatase: 73 U/L (ref 38–126)
Anion gap: 5 (ref 5–15)
BILIRUBIN TOTAL: 0.5 mg/dL (ref 0.3–1.2)
BUN: 10 mg/dL (ref 6–20)
CO2: 20 mmol/L — ABNORMAL LOW (ref 22–32)
Calcium: 9.1 mg/dL (ref 8.9–10.3)
Chloride: 112 mmol/L — ABNORMAL HIGH (ref 101–111)
Creatinine, Ser: 0.88 mg/dL (ref 0.61–1.24)
GFR calc Af Amer: >60 mL/min (ref >60)
GFR calc non Af Amer: >60 mL/min (ref >60)
GLUCOSE: 113 mg/dL — AB (ref 65–99)
POTASSIUM: 3.7 mmol/L (ref 3.5–5.1)
Sodium: 137 mmol/L (ref 135–145)
TOTAL PROTEIN: 7.3 g/dL (ref 6.5–8.1)

## 2017-10-15 LAB — ACETAMINOPHEN LEVEL: Acetaminophen (Tylenol), Serum: <10 ug/mL — ABNORMAL LOW (ref 10–30)

## 2017-10-15 LAB — SALICYLATE LEVEL: Salicylate Lvl: <7 mg/dL (ref 2.8–30.0)

## 2017-10-15 LAB — ETHANOL: Alcohol, Ethyl (B): <10 mg/dL (ref <10)

## 2017-10-15 LAB — METHEMOGLOBIN, BLOOD: Methemoglobin, Blood: 0.5 % (ref 0.4–1.5)

## 2017-10-15 MED ORDER — HALOPERIDOL DECANOATE 100 MG/ML IM SOLN
100.0000 mg | Freq: Once | INTRAMUSCULAR | Status: AC
  Administered 2017-10-15: 100 mg via INTRAMUSCULAR
  Filled 2017-10-15 (×2): qty 1

## 2017-10-15 MED ORDER — HALOPERIDOL LACTATE 5 MG/ML IJ SOLN
5.0000 mg | Freq: Once | INTRAMUSCULAR | Status: AC
  Administered 2017-10-15: 5 mg via INTRAMUSCULAR
  Filled 2017-10-15: qty 1

## 2017-10-15 NOTE — ED Notes (Signed)
Pt wants to go to x-ray first and then receive haldol injection , MD aware

## 2017-10-15 NOTE — ED Notes (Signed)
Patient yelling in flex waiting room.  Patient states, "I'm so manic, I need a shot of haldol now, I can't stop from moving."

## 2017-10-15 NOTE — ED Provider Notes (Signed)
[facility] Emergency Department Provider Note   ____________________________________________   First MD Initiated Contact with Patient 10/15/17 1727     (approximate)  I have reviewed the triage vital signs and the nursing notes.   HISTORY  Chief Complaint Arm Pain    HPI Dylan [name]. is a 42 y.o. male Patient comes in with 2 complaints. The first one is that he was putting on a shirt and something popped in his arm and he can move his elbow hurts a lot. He has had an old injury there with ORIF as I understand it. X-ray fairly recently showed a detached fragment that wasn't causing any problems and wondering if perhaps that has moved. Complaint patient has is that he has not had his Haldol decanoate for quite some time and he is having racing thoughts and keeps taking this but that is presently wearing on his elbow off. He cannot think straight. He is asking for his shot. Otherwise he is feeling well.   Past Medical History:  Diagnosis Date  . ADHD   . Hyperlipemia   . Schizophrenia [facility])     Patient Active Problem List   Diagnosis Date Noted  . Insomnia 09/01/2017  . ADHD 09/01/2017  . 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 ELBOW SURGERY Right     Prior to Admission medications   Medication Sig Start Date End Date Taking? Authorizing Provider  amoxicillin (AMOXIL) 500 MG capsule Take 1 capsule (500 mg total) by mouth 3 (three) times daily. Patient not taking: Reported on 10/14/2017 12/19/16   Irean Hong, MD  dextroamphetamine (DEXEDRINE SPANSULE) 15 MG 24 hr capsule Take 30 mg by mouth daily. 09/23/17   [provider]  HYDROcodone-acetaminophen (NORCO) 5-325 MG tablet Take 1 tablet by mouth every 6 (six) hours as needed for moderate pain. Patient not taking: Reported on 10/14/2017 12/19/16   Irean Hong, MD  ibuprofen (ADVIL,MOTRIN) 800 MG tablet Take 1 tablet  (800 mg total) by mouth every 8 (eight) hours as needed for moderate pain. Patient not taking: Reported on 10/14/2017 12/19/16   Irean Hong, MD  naproxen (NAPROSYN) 500 MG tablet Take 1 tablet (500 mg total) by mouth 2 (two) times daily with a meal. Patient not taking: Reported on 10/14/2017 10/13/17   Joni Reining, PA-C    Allergies Antihistamines, diphenhydramine-type; Prolixin [fluphenazine]; and Risperidone and related  History reviewed. No pertinent family history.  Social History Social History   Tobacco Use  . Smoking status: Current Every Day Smoker    Packs/day: 1.00    Years: 20.00    Pack years: 20.00    Types: Cigarettes  . Smokeless tobacco: Never Used  Substance Use Topics  . Alcohol use: Yes    Comment: occassional use socially  . 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 ENT: No sore throat. Cardiovascular: Denies chest pain. Respiratory: Denies shortness of breath. Gastrointestinal: No abdominal pain.  No nausea, no vomiting.  No diarrhea.  No constipation. Genitourinary: Negative for dysuria. Musculoskeletal: Negative for back pain. Skin: Negative for rash. Neurological: Negative for headaches, focal weakness ____________________________________________   PHYSICAL EXAM:  VITAL SIGNS: ED Triage Vitals  Enc Vitals Group     BP 10/15/17 1650 (!) 142/81     Pulse Rate 10/15/17 1650 88  Resp 10/15/17 1650 18     Temp 10/15/17 1650 97.8 F (36.6 C)     Temp Source 10/15/17 1650 Oral     SpO2 10/15/17 1650 99 %     Weight 10/15/17 1651 230 lb (104.3 kg)     Height 10/15/17 1651 6\' 2"  (1.88 m)     Head Circumference --      Peak Flow --      Pain Score 10/15/17 1652 10     Pain Loc --      Pain Edu? --      Excl. in GC? --     Constitutional: Alert and oriented. Well appearing and in no acute distress. Eyes: Conjunctivae are normal. Head:  Atraumatic. Nose: No congestion/rhinnorhea. Mouth/Throat: Mucous membranes are moist.  Oropharynx non-erythematous. Neck: No stridor.  Cardiovascular: Normal rate, regular rhythm. Grossly normal heart sounds.  Good peripheral circulation. Respiratory: Normal respiratory effort.  No retractions. Lungs CTAB. Gastrointestinal: Soft and nontender. No distention. No abdominal bruits. No CVA tenderness. }Musculoskeletal: No lower extremity tenderness nor edema. right elbow is tender to palpation patient doesn't want me to touch it. Initially he didn't want it x-rayed until after he got a shot but then he said he hasn't slept for 3 days and is worried that if we give him a shot he will be on stairway. Neurologic:  Normal speech and language.patient walking normally. Skin:  Skin is warm, dry and intact. No rash noted. Psychiatric: Mood and affect are normal. Speech and behavior are normal.  ____________________________________________   LABS (all labs ordered are listed, but only abnormal results are displayed)  Labs Reviewed  COMPREHENSIVE METABOLIC PANEL - Abnormal; Notable for the following components:      Result Value   Chloride 112 (*)    CO2 20 (*)    Glucose, Bld 113 (*)    AST 47 (*)    All other components within normal limits  ACETAMINOPHEN LEVEL - Abnormal; Notable for the following components:   Acetaminophen (Tylenol), Serum <10 (*)    All other components within normal limits  ETHANOL  SALICYLATE LEVEL  CBC  URINE DRUG SCREEN, QUALITATIVE ([facility] ONLY)   ____________________________________________  EKG   ____________________________________________  RADIOLOGY  ED MD interpretation:  chest x-ray done yesterday was read as essentially normal low lung volumes.x-ray shows no acute changes in the elbow  Official radiology report(s): Dg Elbow Complete Right  Result Date: 10/15/2017 CLINICAL DATA:  Right elbow pain since falling last week. EXAM: RIGHT ELBOW - COMPLETE 3+  VIEW COMPARISON:  10/13/2017 FINDINGS: Screw and plate fixation of the distal humerus and proximal ulna with stable hardware positioning. No evidence of acute fracture or malalignment. Heterotopic ossification from the ventral lower humerus. IMPRESSION: Stable posttraumatic and postsurgical findings.  No acute finding. Electronically Signed   By: Marnee Spring M.D.   On: 10/15/2017 18:17    ____________________________________________   PROCEDURES  Procedure(s) performed:   Procedures  Critical Care performed:   ____________________________________________   INITIAL IMPRESSION / ASSESSMENT AND PLAN / ED COURSE  patient feeling better after his Haldol shots. He is stable his arm is not change L have follow-up with orthopedics in follow-up with [facility] as needed we'll let him go.     Clinical Course as of Oct 16 1998  Fri Oct 15, 2017  1726 DG Elbow Complete Right [PM]    Clinical Course User Index [PM] Arnaldo Natal, MD     ____________________________________________   FINAL CLINICAL IMPRESSION(S) /  ED DIAGNOSES  Final diagnoses:  Right arm pain     ED Discharge Orders    None       Note:  This document was prepared using Dragon voice recognition software and may include unintentional dictation errors.    Arnaldo NatalMalinda, Slayter Moorhouse F, MD 10/15/17 2000

## 2017-10-15 NOTE — ED Notes (Signed)
Pt. Going home with friend 

## 2017-10-15 NOTE — Discharge Instructions (Addendum)
please continue to wear your sling. Please follow-up with your doctor and psychiatrist . remember you can go to [facility] if you need acute care for anything else. And you can always return here. please follow-up with orthopedics for your elbow as well. Dr Loralie Champagneurrani is on call.

## 2017-10-15 NOTE — ED Notes (Signed)
Patient denies SI and HI.

## 2017-10-15 NOTE — ED Triage Notes (Signed)
Pt comes into the ED via POV c/o right arm pain after he put his shirt on.  Patient unwilling to move his arm at this time. No known injury to the arm, and no deformity noted to the arm as well.  Patient in NAD at this time with even and unlabored respirations.

## 2017-10-21 LAB — COOXEMETRY PANEL
Carboxyhemoglobin: 4.8 % — ABNORMAL HIGH (ref 0.5–1.5)
METHEMOGLOBIN: 0.7 % (ref 0.0–1.5)
O2 Saturation: 82.5 %

## 2018-01-23 ENCOUNTER — Other Ambulatory Visit: Payer: Self-pay

## 2018-01-23 ENCOUNTER — Emergency Department
Admission: EM | Admit: 2018-01-23 | Discharge: 2018-01-23 | Disposition: A | Discharge status: Home / Self Care | Payer: Self-pay | Attending: Emergency Medicine | Admitting: Emergency Medicine

## 2018-01-23 ENCOUNTER — Encounter: Payer: Self-pay | Admitting: Emergency Medicine

## 2018-01-23 DIAGNOSIS — F17210 Nicotine dependence, cigarettes, uncomplicated: Secondary | ICD-10-CM | POA: Insufficient documentation

## 2018-01-23 DIAGNOSIS — G4700 Insomnia, unspecified: Secondary | ICD-10-CM | POA: Insufficient documentation

## 2018-01-23 DIAGNOSIS — Z760 Encounter for issue of repeat prescription: Secondary | ICD-10-CM | POA: Insufficient documentation

## 2018-01-23 DIAGNOSIS — Z79899 Other long term (current) drug therapy: Secondary | ICD-10-CM | POA: Insufficient documentation

## 2018-01-23 DIAGNOSIS — F419 Anxiety disorder, unspecified: Secondary | ICD-10-CM | POA: Insufficient documentation

## 2018-01-23 DIAGNOSIS — R4189 Other symptoms and signs involving cognitive functions and awareness: Secondary | ICD-10-CM | POA: Insufficient documentation

## 2018-01-23 MED ORDER — BENZTROPINE MESYLATE 1 MG/ML IJ SOLN
0.5000 mg | Freq: Once | INTRAMUSCULAR | Status: AC
  Administered 2018-01-23: 0.5 mg via INTRAMUSCULAR
  Filled 2018-01-23 (×2): qty 0.5

## 2018-01-23 MED ORDER — HALOPERIDOL LACTATE 5 MG/ML IJ SOLN
5.0000 mg | Freq: Once | INTRAMUSCULAR | Status: AC
  Administered 2018-01-23: 5 mg via INTRAMUSCULAR
  Filled 2018-01-23: qty 1

## 2018-01-23 NOTE — ED Provider Notes (Signed)
[facility] Emergency Department Provider Note ____________________________________________   First MD Initiated Contact with Patient 01/23/18 2101     (approximate)  I have reviewed the triage vital signs and the nursing notes.   HISTORY  Chief Complaint Medication Refill    HPI Dylan Baloed A Eastham Jr. is a [age] y.o. male with PMH as noted below who presents with insomnia and racing thoughts, and requests Haldol.  The patient states that he works at night and has been trying to get sleep before work.  He reports increased  insomnia and some anxiety over the last several days.  He states that he has felt like this before, and feels like a manic phase is coming on.  He states that when he has this he comes to the hospital and gets a Haldol shot which resolves the symptoms and allows him to function.  He denies any invasive or uncontrollable thoughts, hearing voices, other hallucinations, depression, or any thoughts of wanting to hurt himself or anyone else.  Past Medical History:  Diagnosis Date  . ADHD   . Hyperlipemia   . Schizophrenia [facility](HCC)     Patient Active Problem List   Diagnosis Date Noted  . Insomnia 09/01/2017  . ADHD 09/01/2017  . 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 ELBOW SURGERY Right     Prior to Admission medications   Medication Sig Start Date End Date Taking? Authorizing Provider  amoxicillin (AMOXIL) 500 MG capsule Take 1 capsule (500 mg total) by mouth 3 (three) times daily. Patient not taking: Reported on 10/14/2017 12/19/16   Irean HongSung, Jade J, MD  dextroamphetamine (DEXEDRINE SPANSULE) 15 MG 24 hr capsule Take 30 mg by mouth daily. 09/23/17   [provider]  HYDROcodone-acetaminophen (NORCO) 5-325 MG tablet Take 1 tablet by mouth every 6 (six) hours as needed for moderate pain. Patient not taking: Reported on 10/14/2017 12/19/16   Irean HongSung, Jade J, MD  ibuprofen  (ADVIL,MOTRIN) 800 MG tablet Take 1 tablet (800 mg total) by mouth every 8 (eight) hours as needed for moderate pain. Patient not taking: Reported on 10/14/2017 12/19/16   Irean HongSung, Jade J, MD  naproxen (NAPROSYN) 500 MG tablet Take 1 tablet (500 mg total) by mouth 2 (two) times daily with a meal. Patient not taking: Reported on 10/14/2017 10/13/17   Joni ReiningSmith, Ronald K, PA-C    Allergies Antihistamines, diphenhydramine-type; Prolixin [fluphenazine]; and Risperidone and related  No family history on file.  Social History Social History   Tobacco Use  . Smoking status: Current Every Day Smoker    Packs/day: 1.00    Years: 20.00    Pack years: 20.00    Types: Cigarettes  . Smokeless tobacco: Never Used  Substance Use Topics  . Alcohol use: Yes    Comment: occassional use socially  . 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 Eyes: No visual changes. Cardiovascular: Denies chest pain. Respiratory: Denies shortness of breath. Gastrointestinal: No vomiting. Musculoskeletal: Negative for back pain. Skin: Negative for rash. Neurological: Negative for headache.   ____________________________________________   PHYSICAL EXAM:  VITAL SIGNS: ED Triage Vitals [01/23/18 2034]  Enc Vitals Group     BP 118/74     Pulse Rate (!) 103     Resp 18     Temp 97.9 F (36.6 C)     Temp Source Oral  SpO2 97 %     Weight 225 lb (102.1 kg)     Height 6\' 2"  (1.88 m)     Head Circumference      Peak Flow      Pain Score 0     Pain Loc      Pain Edu?      Excl. in GC?     Constitutional: Alert and oriented. Well appearing and in no acute distress. Eyes: Conjunctivae are normal.  Head: Atraumatic. Nose: No congestion/rhinnorhea. Mouth/Throat: Mucous membranes are moist.   Neck: Normal range of motion.  Cardiovascular:  Good peripheral circulation. Respiratory: Normal respiratory effort.  Gastrointestinal: No distention.    Musculoskeletal:  Extremities warm and well perfused.  Neurologic:  Normal speech and language. No gross focal neurologic deficits are appreciated.  Skin:  Skin is warm and dry. No rash noted. Psychiatric: Mood and affect are normal. Speech and behavior are normal.  ____________________________________________   LABS (all labs ordered are listed, but only abnormal results are displayed)  Labs Reviewed - No data to display ____________________________________________  EKG   ____________________________________________  RADIOLOGY    ____________________________________________   PROCEDURES  Procedure(s) performed: No  Procedures  Critical Care performed: No ____________________________________________   INITIAL IMPRESSION / ASSESSMENT AND PLAN / ED COURSE  Pertinent labs & imaging results that were available during my care of the patient were reviewed by me and considered in my medical decision making (see chart for details).  42 year old male with PMH as noted above presents with insomnia and somewhat racing thoughts, and is requesting Haldol and Cogentin.  He states that this has happened previously, a few times per year.  The patient is calm, behaving appropriately, and does not demonstrate any disorganized thought, delusions, or hallucinations.  I reviewed the past medical records in epic; the patient was seen in the ED on 09/01/2017 describing identical symptoms at that time.  He was evaluated by Dr. Toni Amend who left a not, diagnosed the patient with insomnia and a history of schizotypal personality e.  At that time he was in fact given a dose of Haldol and Cogentin and discharged.  At this time, given that the patient is presenting for an identical set of symptoms, I feel that the same treatment would be appropriate.  The patient does not have frequent ED visits or any evidence of drug-seeking behavior from the available information.  There is no evidence of active  substance abuse based on his history.  Given that it is after hours, I am not able to consult with Dr. Toni Amend.  Since the patient has no evidence of acute mania or psychosis and is not a danger to himself or others, there is no indication for emergent psychiatric consultation via tele-psychiatry.  Therefore, I feel the most appropriate plan would be to treat the patient as previously and have him follow-up with his regular doctor and therapist.  Return precautions given, and the patient expresses understanding.  He states he will be picked up by his significant other.   ____________________________________________   FINAL CLINICAL IMPRESSION(S) / ED DIAGNOSES  Final diagnoses:  Insomnia, unspecified type      NEW MEDICATIONS STARTED DURING THIS VISIT:  New Prescriptions   No medications on file     Note:  This document was prepared using Dragon voice recognition software and may include unintentional dictation errors.    Dionne Bucy, MD 01/23/18 2120

## 2018-01-23 NOTE — ED Notes (Signed)
Pt reports feeling like he needs medication as he feels "a manic episode coming on". Pt calm and cooperative at this time.

## 2018-01-23 NOTE — ED Triage Notes (Signed)
Pt comes into the ED via POV c/o need for his haldol and cogentin shot.  Patient states he comes in here when he feels a manic phase coming on and he needs medication to help him calm down and get some sleep.  Denies any SI or HI.  Patient calm and cooperative at this time.

## 2018-01-23 NOTE — Discharge Instructions (Addendum)
Follow-up with your primary care doctor and therapist and take your regular medications as prescribed.  Return to the ER for new, worsening, or persistent insomnia, anxiety, invasive thoughts, hearing voices, or any thoughts of wanting to hurt yourself or anyone else.

## 2018-02-15 ENCOUNTER — Emergency Department
Admission: EM | Admit: 2018-02-15 | Discharge: 2018-02-15 | Disposition: A | Discharge status: Home / Self Care | Payer: Self-pay | Attending: Emergency Medicine | Admitting: Emergency Medicine

## 2018-02-15 ENCOUNTER — Other Ambulatory Visit: Payer: Self-pay

## 2018-02-15 DIAGNOSIS — E785 Hyperlipidemia, unspecified: Secondary | ICD-10-CM | POA: Insufficient documentation

## 2018-02-15 DIAGNOSIS — K0889 Other specified disorders of teeth and supporting structures: Secondary | ICD-10-CM | POA: Insufficient documentation

## 2018-02-15 DIAGNOSIS — Z79899 Other long term (current) drug therapy: Secondary | ICD-10-CM | POA: Insufficient documentation

## 2018-02-15 DIAGNOSIS — F17210 Nicotine dependence, cigarettes, uncomplicated: Secondary | ICD-10-CM | POA: Insufficient documentation

## 2018-02-15 MED ORDER — AMOXICILLIN 500 MG PO CAPS
500.0000 mg | ORAL_CAPSULE | Freq: Once | ORAL | Status: AC
  Administered 2018-02-15: 500 mg via ORAL
  Filled 2018-02-15: qty 1

## 2018-02-15 MED ORDER — AMOXICILLIN 500 MG PO TABS: 500.0000 mg | ORAL_TABLET | Freq: Three times a day (TID) | ORAL | 0 refills | Status: DC

## 2018-02-15 NOTE — ED Provider Notes (Signed)
[facility] Emergency Department Provider Note ____________________________________________  Time seen: Approximately 1:51 AM  I have reviewed the triage vital signs and the nursing notes.   HISTORY  Chief Complaint Dental Pain   HPI Dylan Baloed A Malhotra Jr. is a 42 y.o. male who presents to the emergency department for treatment and evaluation of dental pain.   Pain started 2 days ago.  This is acute on chronic.  He has taken some ibuprofen without relief.  He states he has been able to find a dental clinic in the area that he can afford.  He denies fever or other symptoms of concern.  Past Medical History:  Diagnosis Date  . ADHD   . Hyperlipemia   . Schizophrenia [facility](HCC)     Patient Active Problem List   Diagnosis Date Noted  . Insomnia 09/01/2017  . ADHD 09/01/2017  . 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 ELBOW SURGERY Right     Prior to Admission medications   Medication Sig Start Date End Date Taking? Authorizing Provider  amoxicillin (AMOXIL) 500 MG tablet Take 1 tablet (500 mg total) by mouth 3 (three) times daily. 02/15/18   Carolyna Yerian, Rulon Eisenmengerari B, FNP  dextroamphetamine (DEXEDRINE SPANSULE) 15 MG 24 hr capsule Take 30 mg by mouth daily. 09/23/17   [provider]  HYDROcodone-acetaminophen (NORCO) 5-325 MG tablet Take 1 tablet by mouth every 6 (six) hours as needed for moderate pain. Patient not taking: Reported on 10/14/2017 12/19/16   Irean HongSung, Jade J, MD  ibuprofen (ADVIL,MOTRIN) 800 MG tablet Take 1 tablet (800 mg total) by mouth every 8 (eight) hours as needed for moderate pain. Patient not taking: Reported on 10/14/2017 12/19/16   Irean HongSung, Jade J, MD  naproxen (NAPROSYN) 500 MG tablet Take 1 tablet (500 mg total) by mouth 2 (two) times daily with a meal. Patient not taking: Reported on 10/14/2017 10/13/17   Joni ReiningSmith, Ronald K, PA-C    Allergies Antihistamines, diphenhydramine-type;  Prolixin [fluphenazine]; and Risperidone and related  No family history on file.  Social History Social History   Tobacco Use  . Smoking status: Current Every Day Smoker    Packs/day: 1.00    Years: 20.00    Pack years: 20.00    Types: Cigarettes  . Smokeless tobacco: Never Used  Substance Use Topics  . Alcohol use: Yes    Comment: occassional use socially  . 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 ENT: Positive for dental pain. Musculoskeletal: Negative for trismus of the jaw.  Skin: Negative for wound or lesion. ____________________________________________   PHYSICAL EXAM:  VITAL SIGNS: ED Triage Vitals  Enc Vitals Group     BP 02/15/18 0043 128/75     Pulse Rate 02/15/18 0043 82     Resp 02/15/18 0043 17     Temp 02/15/18 0043 97.8 F (36.6 C)     Temp Source 02/15/18 0043 Oral     SpO2 02/15/18 0043 97 %     Weight 02/15/18 0041 225 lb (102.1 kg)     Height 02/15/18 0041 6\' 2"  (1.88 m)     Head Circumference --      Peak Flow --      Pain Score 02/15/18 0041 4     Pain Loc --      Pain Edu? --      Excl.  in GC? --     Constitutional: Alert and oriented. Well appearing and in no acute distress. Eyes: Conjunctiva are clear without discharge or drainage. Mouth/Throat: Widespread poor oral hygiene. Periodontal Exam    Hematological/Lymphatic/Immunilogical: No palpable adenopathy about the face or neck Respiratory: Respirations even and unlabored. Musculoskeletal: Full ROM of the jaw. Neurologic: Awake, alert, oriented.  Skin: No facial edema or erythema. Psychiatric: Affect and behavior appropriate.  ____________________________________________   LABS (all labs ordered are listed, but only abnormal results are displayed)  Labs Reviewed - No data to display ____________________________________________   RADIOLOGY  Not  indicated. ____________________________________________   PROCEDURES  Procedure(s) performed:   Procedures  Critical Care performed: No ____________________________________________   INITIAL IMPRESSION / ASSESSMENT AND PLAN / ED COURSE  [name] Carmie KannerReaves Jr. is a 42 y.o. male who presents to the emergency department for treatment and evaluation of dental pain.  He will be placed on amoxicillin and an encouraged to follow-up with the dentist as soon as possible.  He will be given a long list of community resources and encouraged to call around until he can get an appointment within the next 2 weeks.  He is encouraged to return to the emergency department for symptoms of change or worsen if he is unable to schedule an appointment.  Pertinent labs & imaging results that were available during my care of the patient were reviewed by me and considered in my medical decision making (see chart for details).  ____________________________________________   FINAL CLINICAL IMPRESSION(S) / ED DIAGNOSES  Final diagnoses:  Pain, dental    Discharge Medication List as of 02/15/2018  1:17 AM    START taking these medications   Details  amoxicillin (AMOXIL) 500 MG tablet Take 1 tablet (500 mg total) by mouth 3 (three) times daily., Starting Tue 02/15/2018, Normal        If controlled substance prescribed during this visit, 12 month history viewed on the [facility] prior to issuing an initial prescription for Schedule II or III opiod.  Note:  This document was prepared using Dragon voice recognition software and may include unintentional dictation errors.    Chinita Pesterriplett, Genean Adamski B, FNP 02/15/18 [id number]    Rebecka ApleyWebster, Allison P, MD 02/15/18 [phone]

## 2018-02-15 NOTE — ED Triage Notes (Signed)
Pt arrives to ED via POV from home with c/o left-sided upper dental pain x1 day. Pt states he has an appt in a week, but can't wait that long d/t concerns for infection.

## 2018-02-15 NOTE — Discharge Instructions (Signed)
Please call and schedule a dental appointment as soon as possible. You will need to be seen within the next 14 days. Return to the emergency department for symptoms that change or worsen if you're unable to schedule an appointment.  OPTIONS FOR DENTAL FOLLOW UP CARE  [place] Department of Health and Human Services - Local Safety Net Dental Clinics http://www.ncdhhs.gov/dph/oralhealth/services/safetynetclinics.htm   [facility] ([phone])  [facility] ([phone])  [facility] ([phone])  [facility] ([phone])  [facility] ([phone]) This clinic caters to the indigent population and is on a lottery system. Location: [facility], [address], [place] Clinic Hours: Wednesdays from 6pm - 9pm, patients seen by a lottery system. For dates, call or go to [email or web address].[email or web address]/[facility]/[email or web address] Services: Cleanings, fillings and simple extractions. Payment Options: DENTAL WORK IS FREE OF CHARGE. Bring proof of income or support. Best way to get seen: Arrive at 5:15 pm - this is a lottery, NOT first come/first serve, so arriving earlier will not increase your chances of being seen.     [facility] [phone] Select option 1 for emergencies   Location: [facility], [address], [place] Clinic Hours: No walk-ins accepted - call the day before to schedule an appointment. Check in times are 9:30 am and 1:30 pm. Services: Simple extractions, temporary fillings, pulpectomy/pulp debridement, uncomplicated abscess drainage. Payment Options: PAYMENT IS DUE AT THE TIME OF SERVICE.  Fee is usually $100-200, additional surgical procedures (e.g. abscess drainage) may be extra. Cash, checks, Visa/MasterCard accepted.  Can file Medicaid if patient is covered for dental - patient should call case worker to check. No  discount for [facility] patients. Best way to get seen: MUST call the day before and get onto the schedule. Can usually be seen the next 1-2 days. No walk-ins accepted.     [facility] [phone]   Location: [facility], [address], [place] Clinic Hours: M, W, Th, F 8am or 1:30pm, Tues 9a or 1:30 - first come/first served. Services: Simple extractions, temporary fillings, uncomplicated abscess drainage.  You do not need to be an [place] resident. Payment Options: PAYMENT IS DUE AT THE TIME OF SERVICE. Dental insurance, otherwise sliding scale - bring proof of income or support. Depending on income and treatment needed, cost is usually $50-200. Best way to get seen: Arrive early as it is first come/first served.     [facility] [phone]   Location: [address] Clinic Hours: Mon-Thu 8a-5p Services: Most basic dental services including extractions and fillings. Payment Options: PAYMENT IS DUE AT THE TIME OF SERVICE. Sliding scale, up to 50% off - bring proof if income or support. Medicaid with dental option accepted. Best way to get seen: Call to schedule an appointment, can usually be seen within 2 weeks OR they will try to see walk-ins - show up at 8a or 2p (you may have to wait).     [facility] [phone] [place] RESIDENTS ONLY   Location: [facility], [address], [place], [place] [address] Clinic Hours: By appointment only. Monday - Thursday 8am-5pm, Friday 8am-12pm Services: Cleanings, fillings, extractions. Payment Options: PAYMENT IS DUE AT THE TIME OF SERVICE. Cash, Visa or MasterCard. Sliding scale - $30 minimum per service. Best way to get seen: Come in to office, complete packet and make an appointment -   need proof of income or support monies for each household member and proof of [place]  residence. Usually takes about a month to get in.     [facility] [phone]   Location: [address] [place] Clinic Hours: Walk-in Urgent Care Dental Services are offered Monday-Friday mornings only. The numbers of emergencies accepted daily is limited to the number of providers available. Maximum 15 - Mondays, Wednesdays & Thursdays Maximum 10 - Tuesdays & Fridays Services: You do not need to be a [place] resident to be seen for a dental emergency. Emergencies are defined as pain, swelling, abnormal bleeding, or dental trauma. Walkins will receive x-rays if needed. NOTE: Dental cleaning is not an emergency. Payment Options: PAYMENT IS DUE AT THE TIME OF SERVICE. Minimum co-pay is $40.00 for uninsured patients. Minimum co-pay is $3.00 for Medicaid with dental coverage. Dental Insurance is accepted and must be presented at time of visit. Medicare does not cover dental. Forms of payment: Cash, credit card, checks. Best way to get seen: If not previously registered with the clinic, walk-in dental registration begins at 7:15 am and is on a first come/first serve basis. If previously registered with the clinic, call to make an appointment.     [facility] [phone] [place] RESIDENTS ONLY   Location: [address], [place], [place] Clinic Hours: Mon-Thu 10a-2p Services: Extractions only! Payment Options: FREE (donations accepted) - bring proof of income or support Best way to get seen: Call and schedule an appointment OR come at 8am on the 1st Monday of every month (except for holidays) when it is first come/first served.     [facility] [phone]   Location: [address], [place] Clinic Hours: Friday mornings Services, Payment Options, Best way to get seen: Call for info  

## 2018-04-05 ENCOUNTER — Other Ambulatory Visit: Payer: Self-pay

## 2018-04-05 ENCOUNTER — Encounter: Payer: Self-pay | Admitting: Intensive Care

## 2018-04-05 ENCOUNTER — Emergency Department
Admission: EM | Admit: 2018-04-05 | Discharge: 2018-04-05 | Disposition: A | Discharge status: Home / Self Care | Payer: Self-pay | Attending: Emergency Medicine | Admitting: Emergency Medicine

## 2018-04-05 DIAGNOSIS — B9789 Other viral agents as the cause of diseases classified elsewhere: Secondary | ICD-10-CM

## 2018-04-05 DIAGNOSIS — F17210 Nicotine dependence, cigarettes, uncomplicated: Secondary | ICD-10-CM | POA: Insufficient documentation

## 2018-04-05 DIAGNOSIS — J069 Acute upper respiratory infection, unspecified: Secondary | ICD-10-CM

## 2018-04-05 DIAGNOSIS — Z79899 Other long term (current) drug therapy: Secondary | ICD-10-CM | POA: Insufficient documentation

## 2018-04-05 MED ORDER — BENZONATATE 200 MG PO CAPS: 200.0000 mg | ORAL_CAPSULE | Freq: Three times a day (TID) | ORAL | 0 refills | Status: DC | PRN

## 2018-04-05 NOTE — ED Provider Notes (Signed)
[facility] Emergency Department Provider Note  ____________________________________________   First MD Initiated Contact with Patient 04/05/18 1108     (approximate)  I have reviewed the triage vital signs and the nursing notes.   HISTORY  Chief Complaint Cough    HPI Dylan [name]. is a 42 y.o. malepresents emergency department complaining of dry cough.  Symptoms for 1 days.  Denies fever, chills, chest pain or shortness of breath.  Did not use any over-the-counter medicines.   Past Medical History:  Diagnosis Date  . ADHD   . Hyperlipemia   . Schizophrenia [facility])     Patient Active Problem List   Diagnosis Date Noted  . Insomnia 09/01/2017  . ADHD 09/01/2017  . 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 ELBOW SURGERY Right     Prior to Admission medications   Medication Sig Start Date End Date Taking? Authorizing Provider  benzonatate (TESSALON) 200 MG capsule Take 1 capsule (200 mg total) by mouth 3 (three) times daily as needed for cough. 04/05/18   Katria Botts, Roselyn Bering, PA-C  dextroamphetamine (DEXEDRINE SPANSULE) 15 MG 24 hr capsule Take 30 mg by mouth daily. 09/23/17   [provider]    Allergies Antihistamines, diphenhydramine-type; Prolixin [fluphenazine]; and Risperidone and related  History reviewed. No pertinent family history.  Social History Social History   Tobacco Use  . Smoking status: Current Every Day Smoker    Packs/day: 1.00    Years: 20.00    Pack years: 20.00    Types: Cigarettes  . Smokeless tobacco: Never Used  Substance Use Topics  . Alcohol use: Yes    Comment: occassional use socially  . 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 ENT: No sore throat. Respiratory: Positive cough  Genitourinary: Negative for dysuria. Musculoskeletal:  Negative for back pain. Skin: Negative for rash.    ____________________________________________   PHYSICAL EXAM:  VITAL SIGNS: ED Triage Vitals  Enc Vitals Group     BP 04/05/18 1045 127/68     Pulse Rate 04/05/18 1045 87     Resp 04/05/18 1045 18     Temp 04/05/18 1045 97.9 F (36.6 C)     Temp Source 04/05/18 1045 Oral     SpO2 04/05/18 1045 98 %     Weight 04/05/18 1045 236 lb (107 kg)     Height 04/05/18 1045 6\' 2"  (1.88 m)     Head Circumference --      Peak Flow --      Pain Score 04/05/18 1051 0     Pain Loc --      Pain Edu? --      Excl. in GC? --     Constitutional: Alert and oriented. Well appearing and in no acute distress. Eyes: Conjunctivae are normal.  Head: Atraumatic. ENT: TMS TMs are clear bilaterally Nose: No congestion/rhinnorhea. Mouth/Throat: Mucous membranes are moist.   NECK: Is supple, no lymphadenopathy is noted  cardiovascular: Normal rate, regular rhythm.  Heart sounds are normal Respiratory: Normal respiratory effort.  No retractions, lungs clear to auscultation GU: deferred Musculoskeletal: FROM all extremities, warm and well perfused Neurologic:  Normal speech and language.  Skin:  Skin is warm, dry and intact. No rash noted. Psychiatric: Mood and affect are normal. Speech and behavior are normal.  ____________________________________________   LABS (  all labs ordered are listed, but only abnormal results are displayed)  Labs Reviewed - No data to display ____________________________________________   ____________________________________________  RADIOLOGY    ____________________________________________   PROCEDURES  Procedure(s) performed: No  Procedures    ____________________________________________   INITIAL IMPRESSION / ASSESSMENT AND PLAN / ED COURSE  Pertinent labs & imaging results that were available during my care of the patient were reviewed by me and considered in my medical decision making (see  chart for details).   Patient is a 42 year old male presents emergency department complaining of cough.  He denies any fever or chills.  No chest pain shortness of breath.  Symptoms started last night.  On physical exam patient appears well.  Lungs clear to auscultation.  Remainder exam is unremarkable.  Discussed findings with patient.  Explained to him this most likely a viral upper respiratory infection.  He was given Jerilynn Som for cough.  He is to follow-up with his regular doctor if not better in 3 to 5 days.  Return emergency department worsening.  States he understands and will comply with treatment plan.  Was discharged in stable condition.     As part of my medical decision making, I reviewed the following data within the electronic MEDICAL RECORD NUMBER Nursing notes reviewed and incorporated, Old chart reviewed, Notes from prior ED visits and [place] Controlled Substance Database  ____________________________________________   FINAL CLINICAL IMPRESSION(S) / ED DIAGNOSES  Final diagnoses:  Viral URI with cough      NEW MEDICATIONS STARTED DURING THIS VISIT:  New Prescriptions   BENZONATATE (TESSALON) 200 MG CAPSULE    Take 1 capsule (200 mg total) by mouth 3 (three) times daily as needed for cough.     Note:  This document was prepared using Dragon voice recognition software and may include unintentional dictation errors.    Faythe Ghee, PA-C 04/05/18 1208    Jeanmarie Plant, MD 04/05/18 [id number]

## 2018-04-05 NOTE — Discharge Instructions (Addendum)
Follow-up with your regular doctor or the acute care if not better in 3 to 5 days.  Return emergency department worsening.  Take medication as prescribed.  Drink plenty fluids and take over-the-counter cold medicines.

## 2018-04-05 NOTE — ED Triage Notes (Signed)
Patient c/o cough and runny nose since yesterday. No problem speaking in complete sentences. Ambulatory back to room with no problems

## 2018-10-12 IMAGING — DX DG ELBOW COMPLETE 3+V*R*
4 series · 4 of 4 positions shown · non-contrast
Comparison: 11/17/2014

CLINICAL DATA: Decreased range of motion and pain secondary to a
fall

EXAM:
RIGHT ELBOW - COMPLETE 3+ VIEW

[elbow ap]
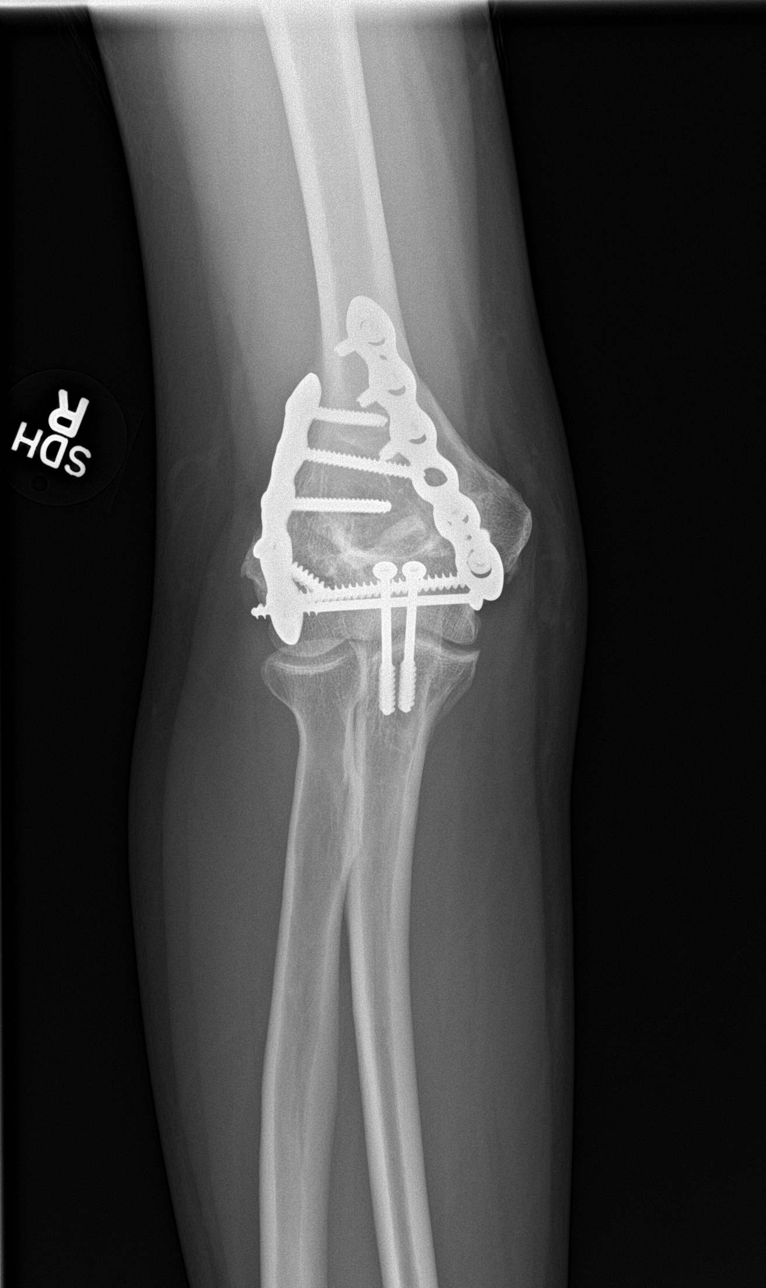

[elbow obl (1 of 2)]
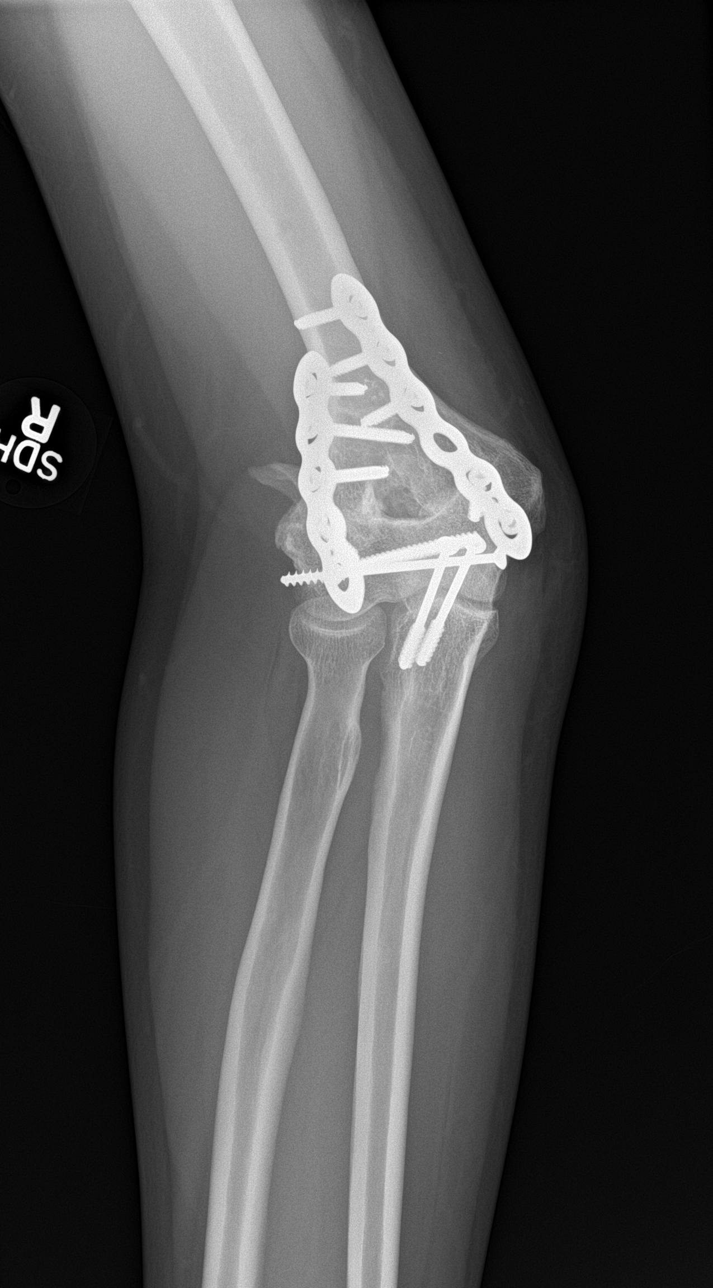

[elbow obl (2 of 2)]
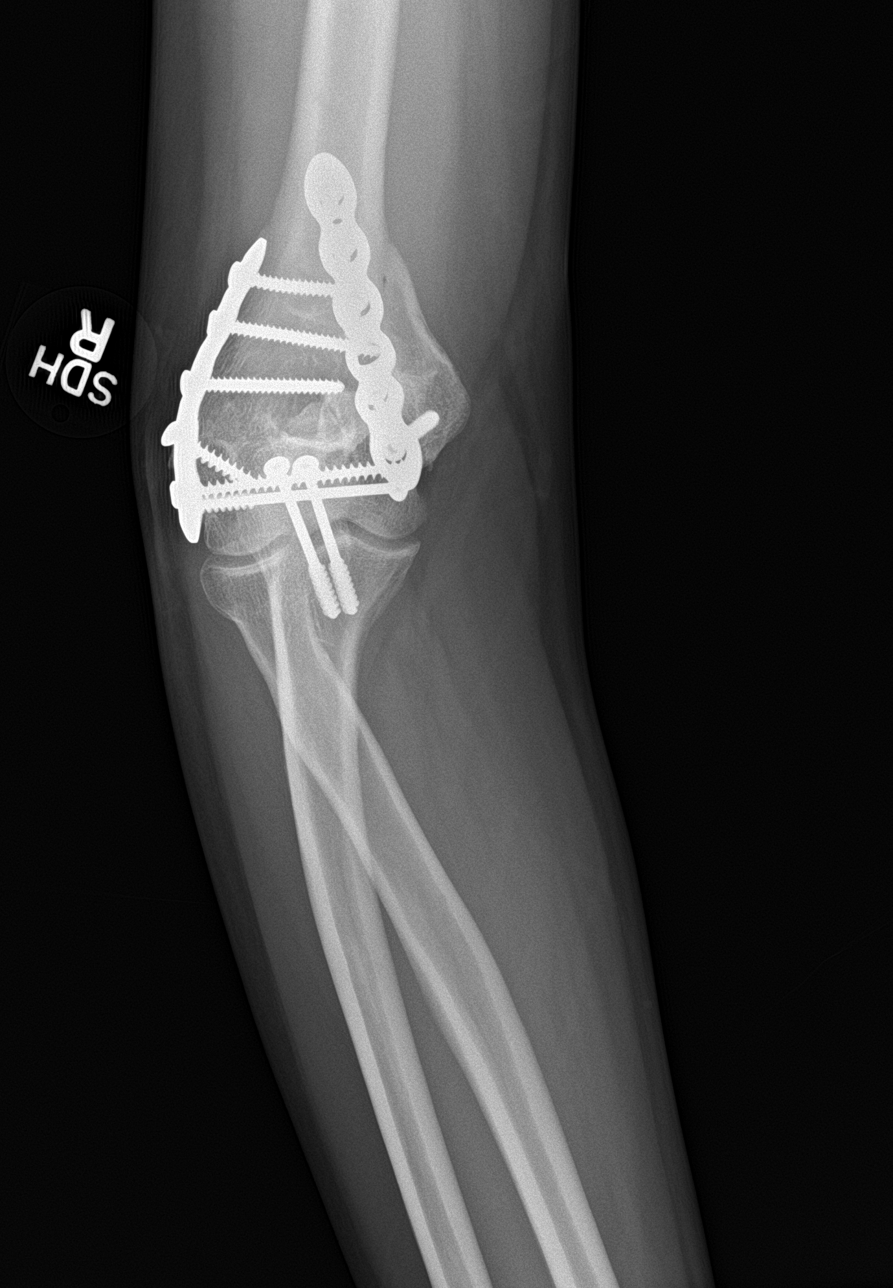

[elbow lat]
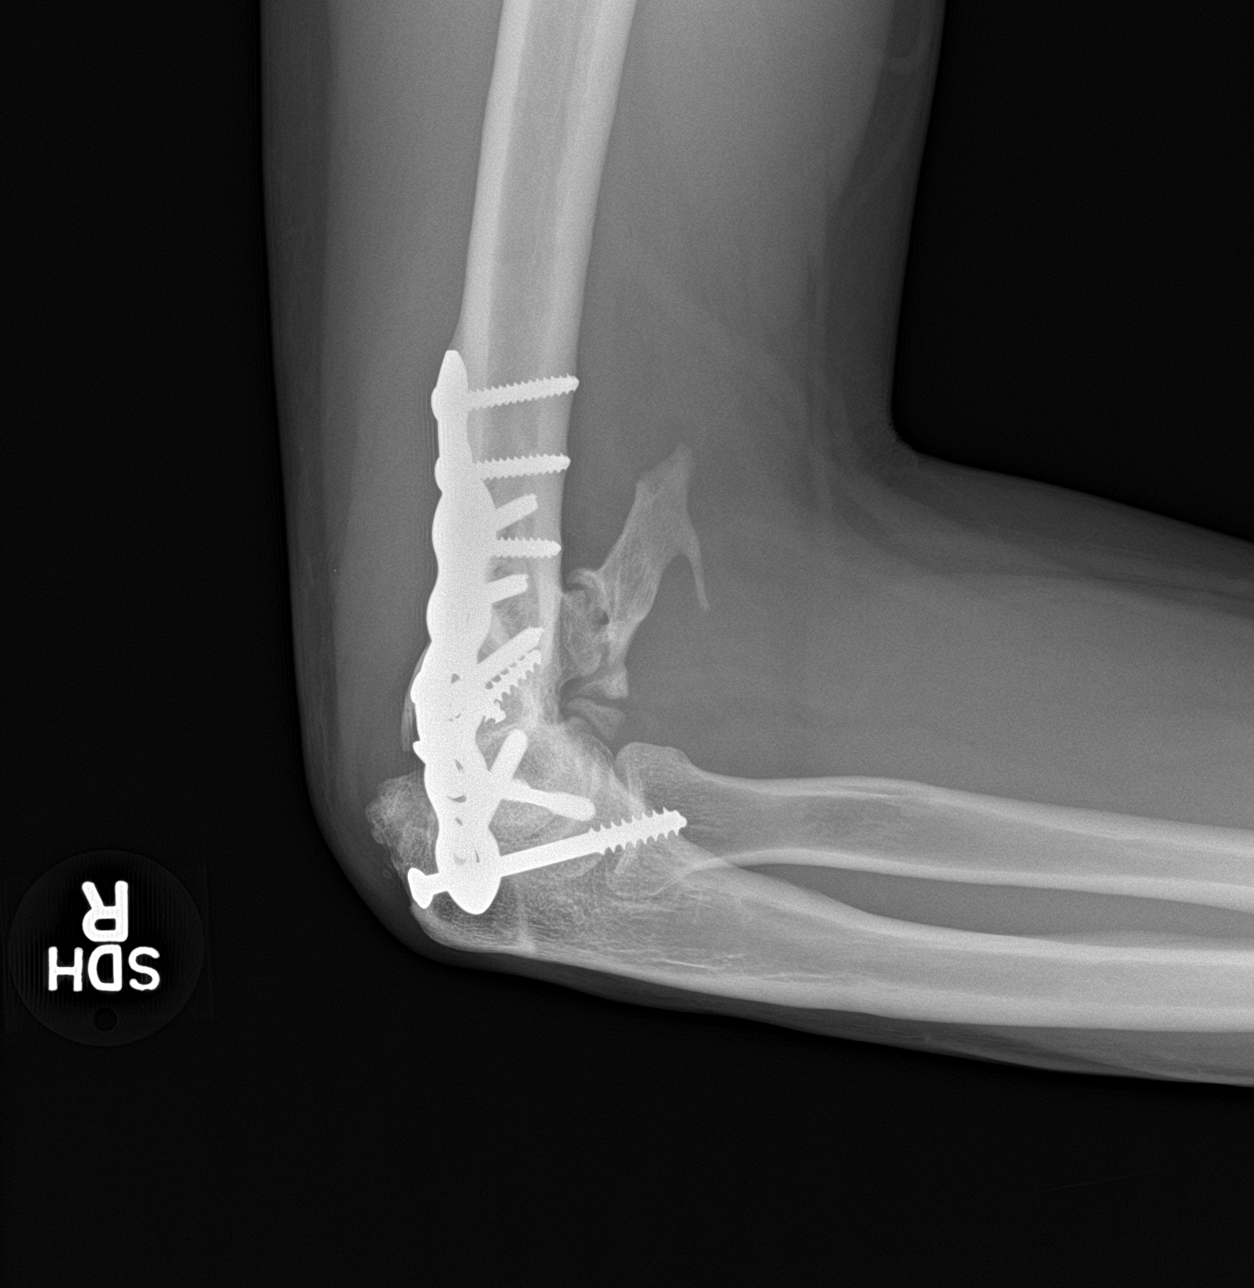

[4 of 4 positions shown; findings below may reference images not displayed]

FINDINGS: Two plates and multiple screws are seen at a deformed distal RIGHT
humerus post ORIF of the comminuted displaced fracture seen on the
previous exam.

Two screws are present in the proximal ulna.

Joint spaces preserved.

Soft tissue calcification versus nonunion of an old fracture
fragment anterior to the distal humerus on the lateral view.

No acute fracture, dislocation, or bone destruction.
IMPRESSION: Extensive posttraumatic and postsurgical deformities of the distal
humerus with evidence of prior olecranon ORIF as well.

No acute bony findings.

Non fused fracture fragment versus soft tissue calcification
anterior to the distal humerus.

## 2018-10-13 IMAGING — CR DG CHEST 2V
1 series · 2 of 2 positions shown · non-contrast
Comparison: 11/06/2012

CLINICAL DATA: Seizure.

EXAM:
CHEST - 2 VIEW

[Series 1: dg chest 2 view · 0.14mm/px · 2 of 2 slices shown]
[im 1/2]
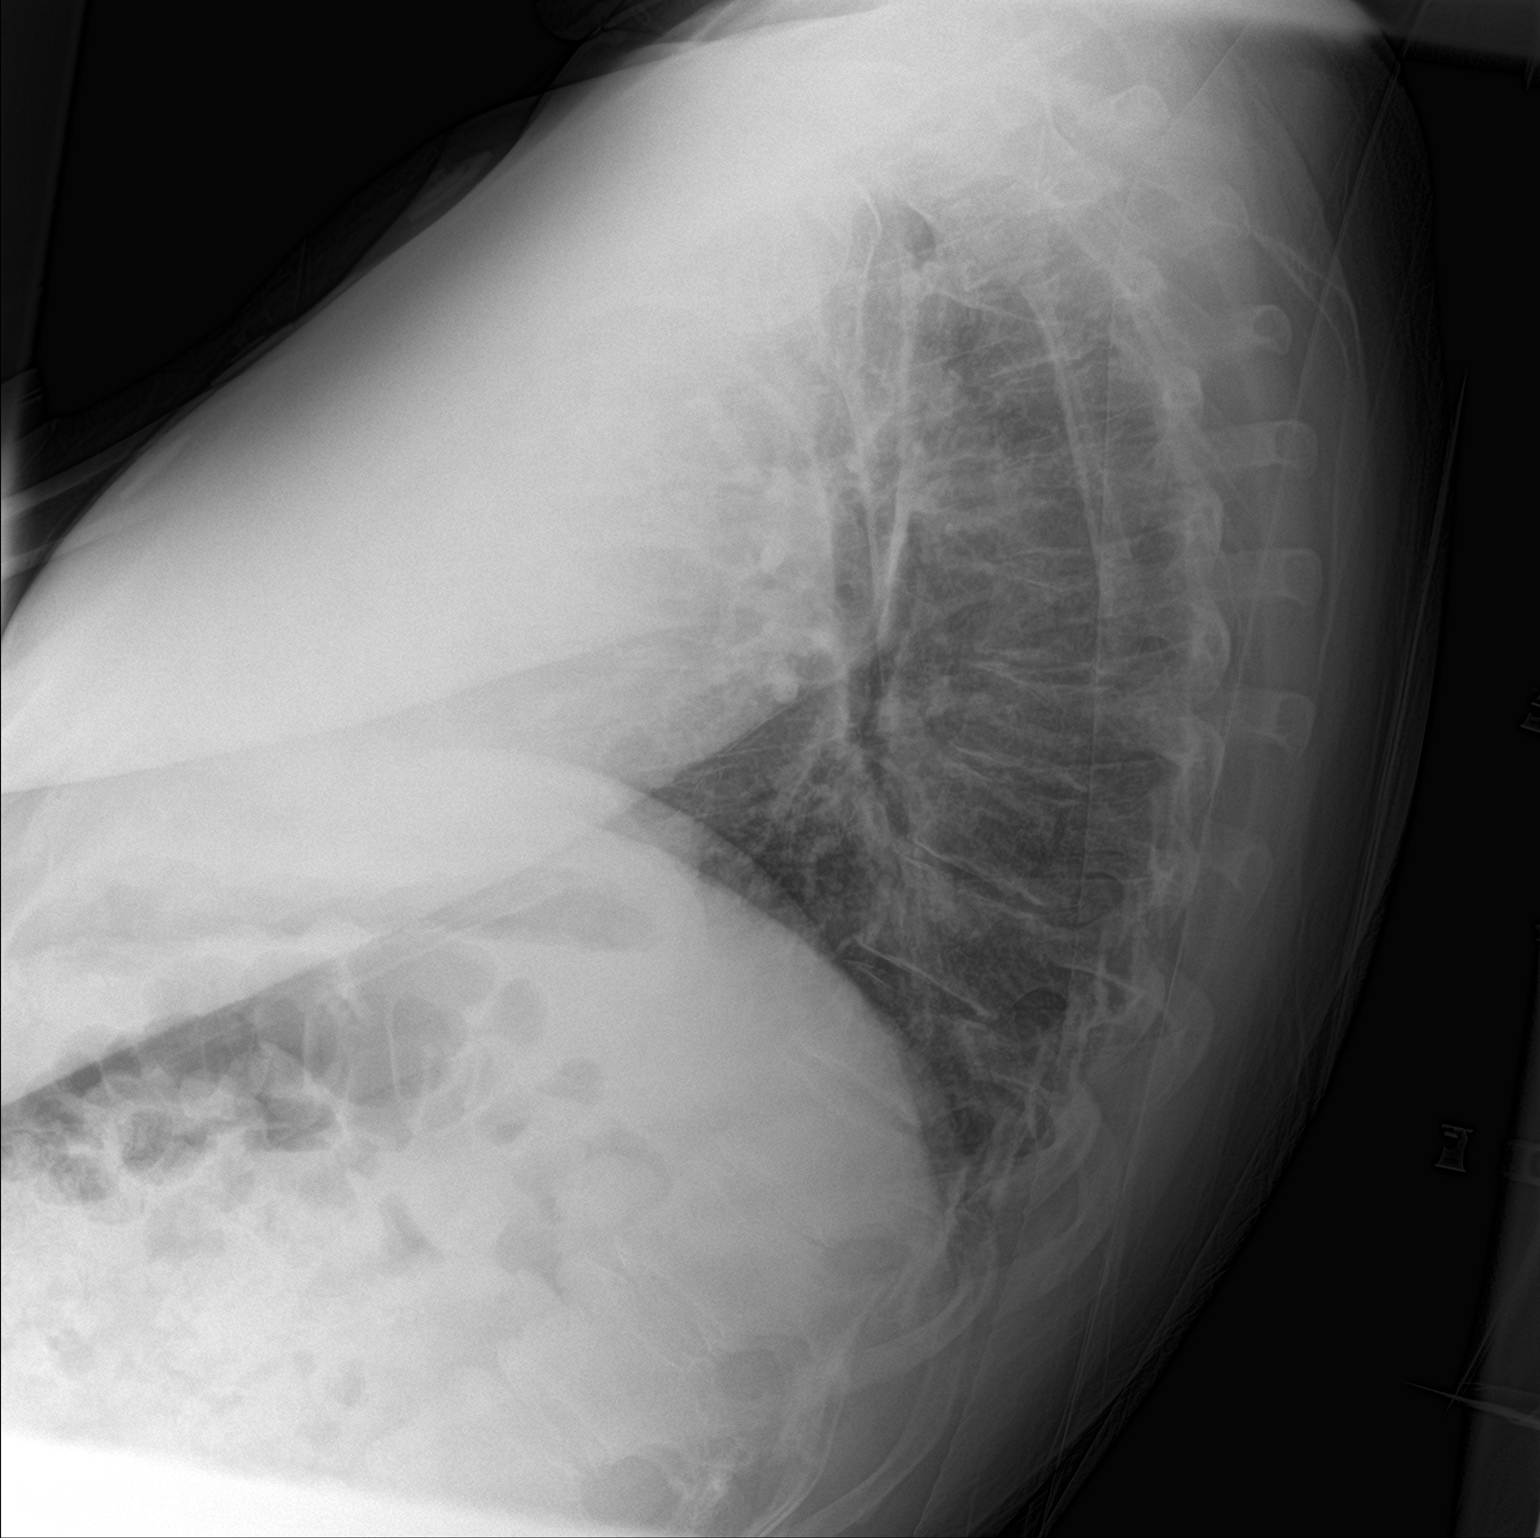
[im 2/2]
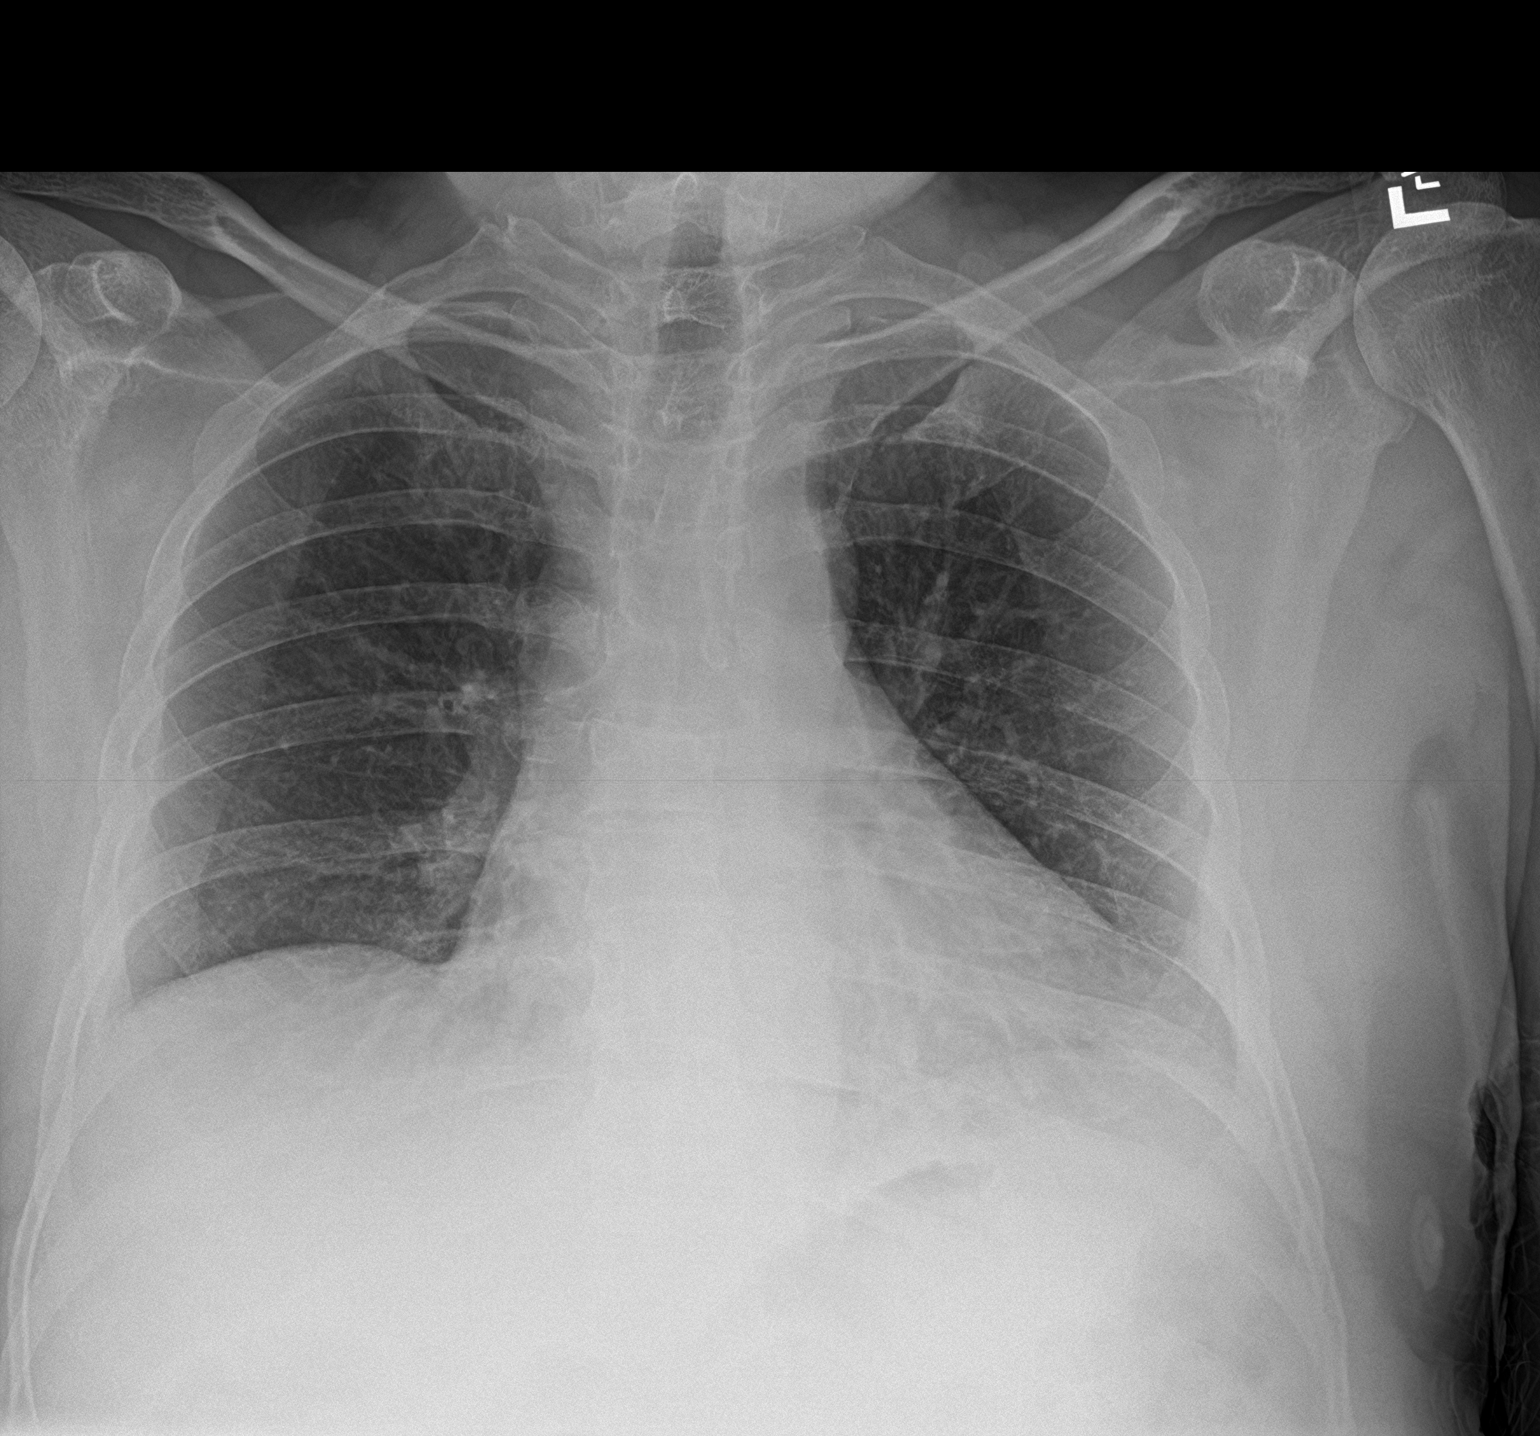

[2 of 2 positions shown; findings below may reference images not displayed]

FINDINGS: Low volume film. The lungs are clear without focal pneumonia, edema,
pneumothorax or pleural effusion. Cardiopericardial silhouette is at
upper limits of normal for size. The visualized bony structures of
the thorax are intact.
IMPRESSION: Stable.  Low volume film without acute cardiopulmonary findings.

## 2018-10-14 IMAGING — CR DG ELBOW COMPLETE 3+V*R*
1 series · 4 of 4 positions shown · non-contrast
Comparison: 10/13/2017

CLINICAL DATA: Right elbow pain since falling last week.

EXAM:
RIGHT ELBOW - COMPLETE 3+ VIEW

[Series 1: dg elbow complete right (3+view) · 0.14mm/px · 4 of 4 slices shown]
[im 1/4]
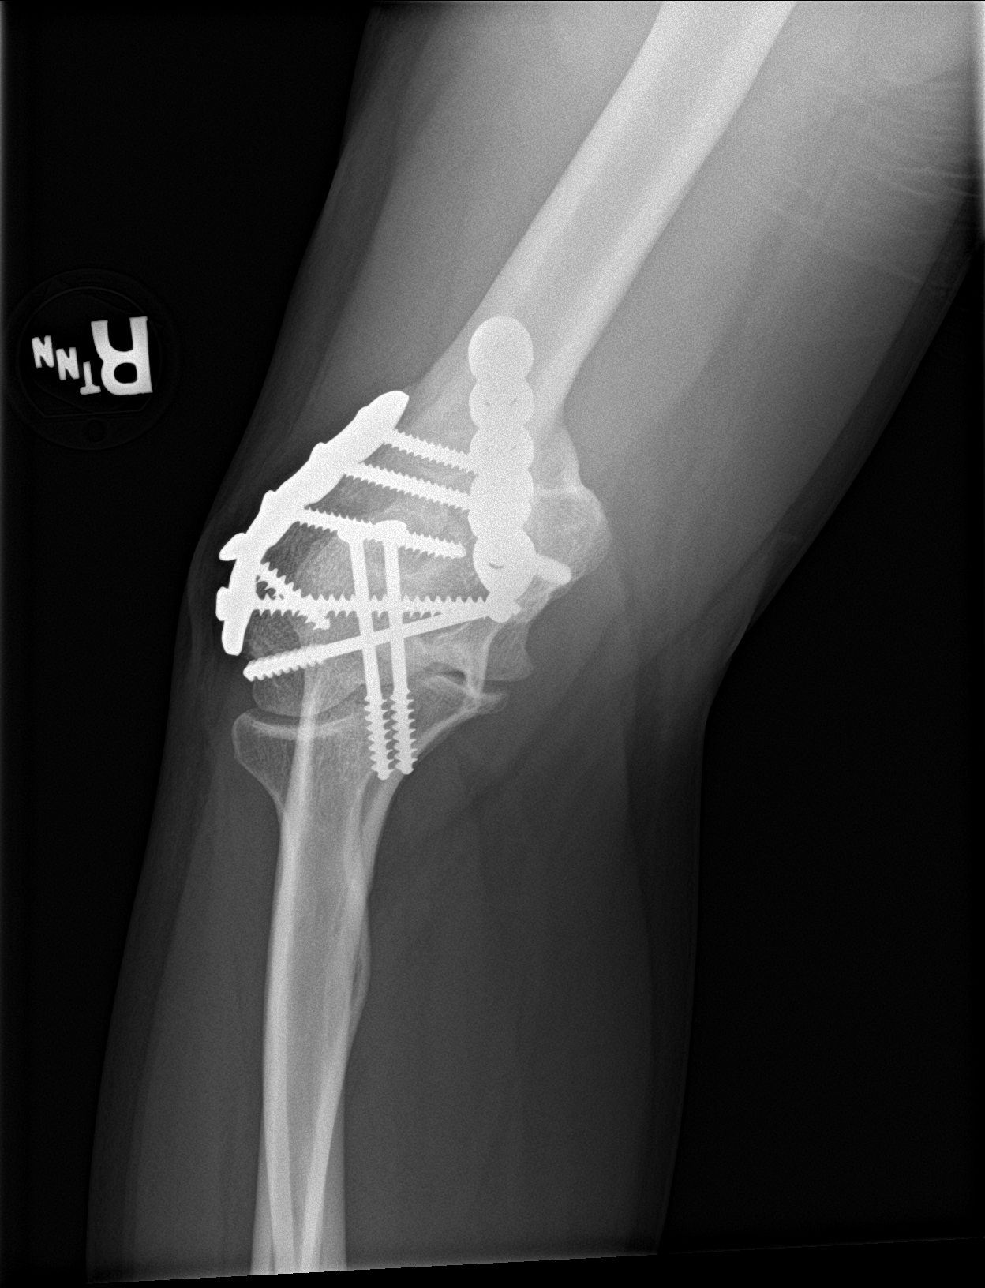
[im 2/4]
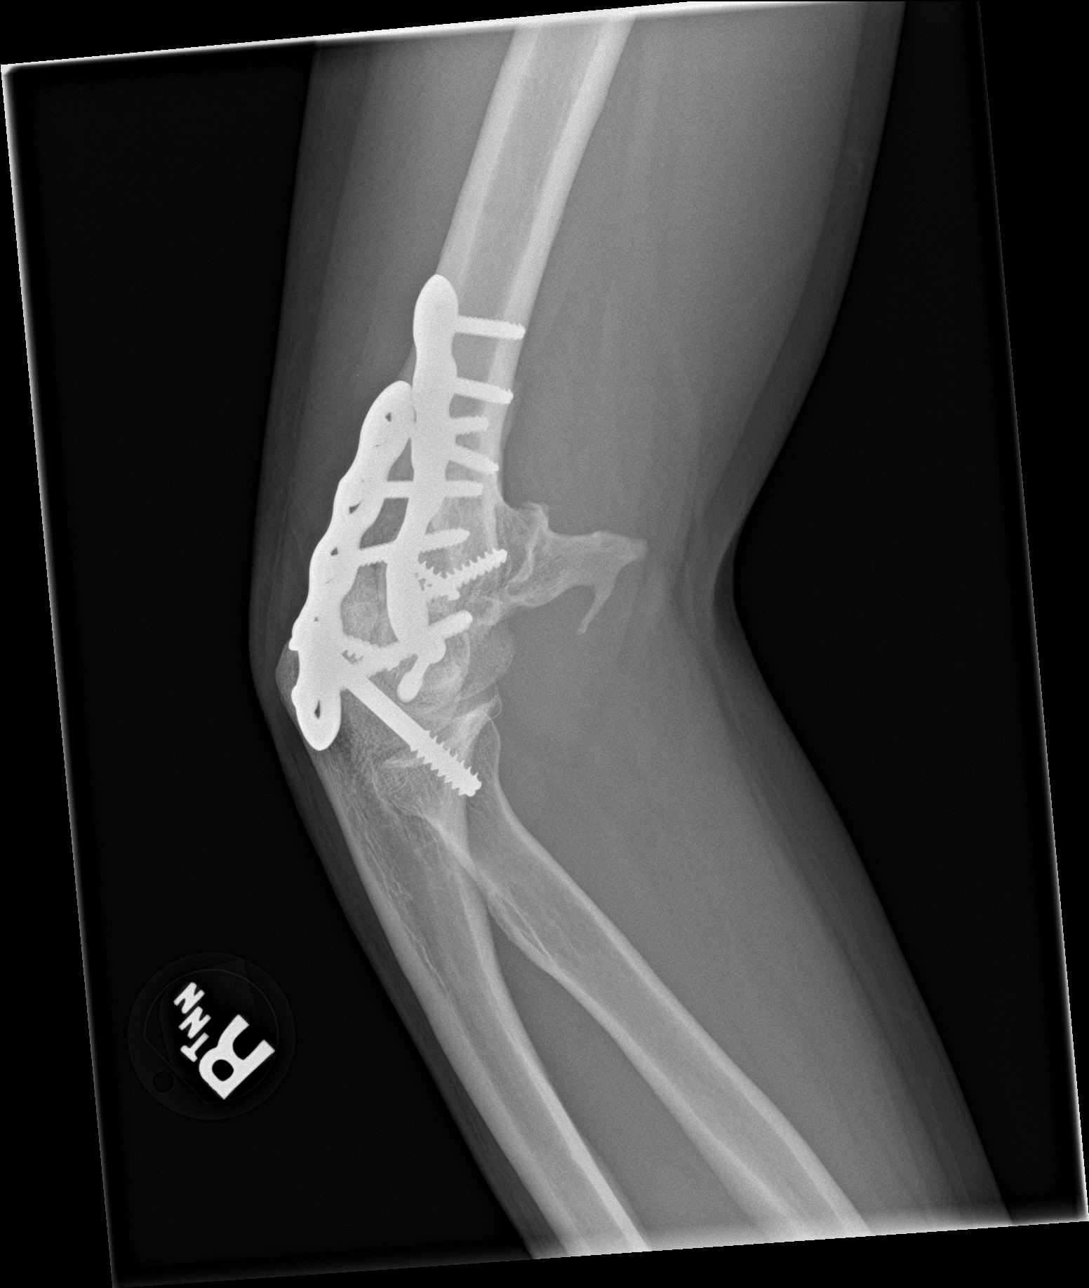
[im 3/4]
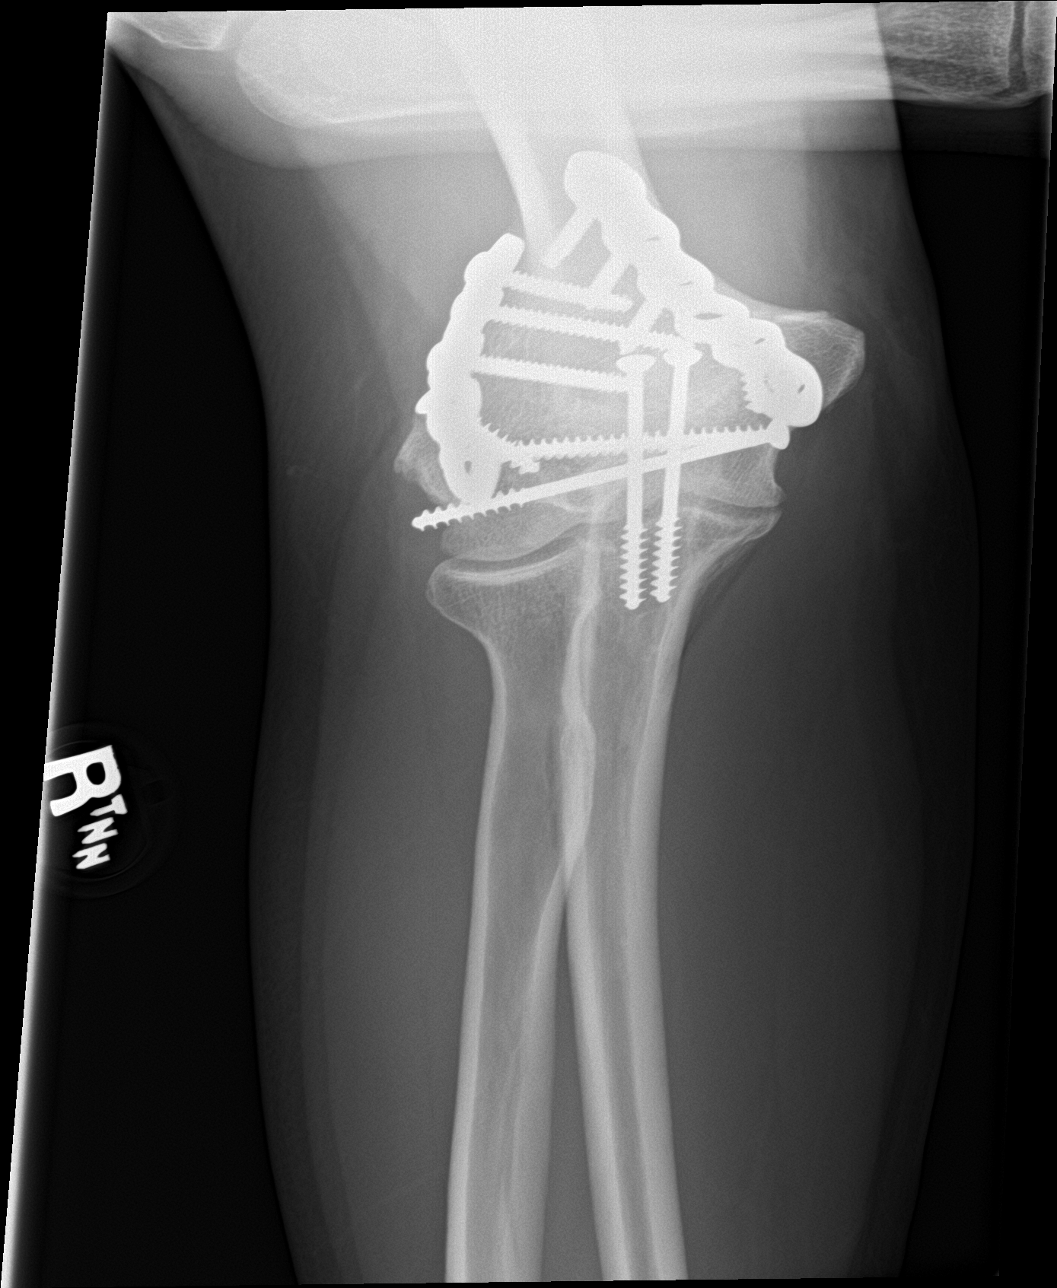
[im 4/4]
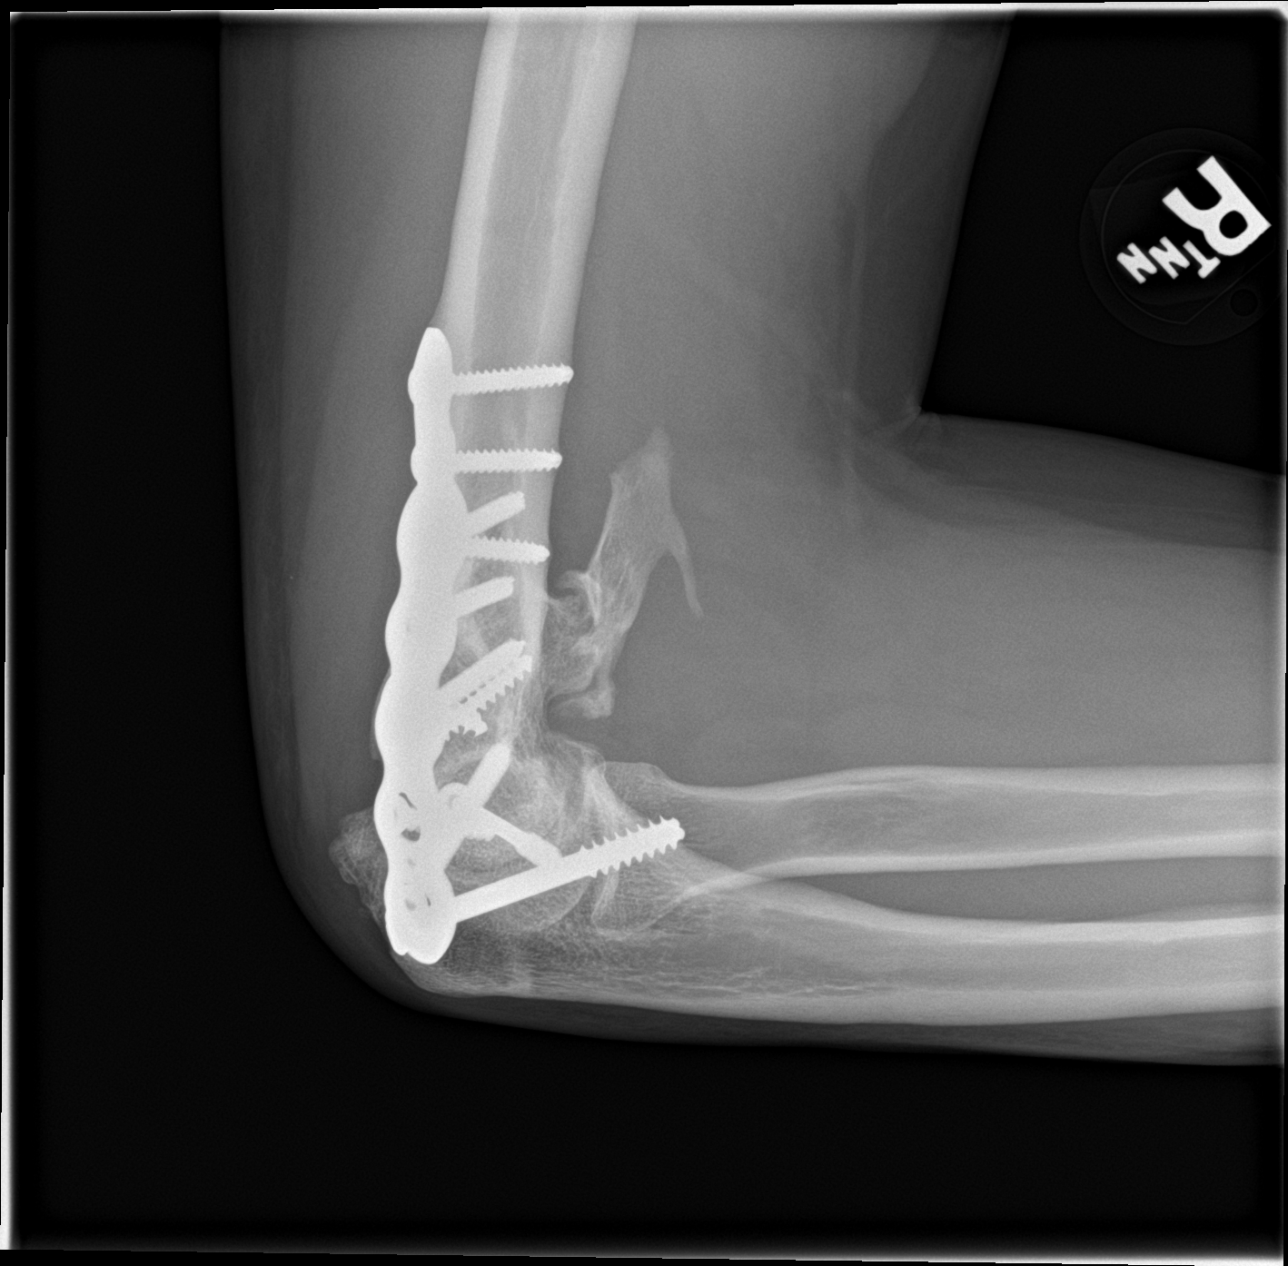

[4 of 4 positions shown; findings below may reference images not displayed]

FINDINGS: Screw and plate fixation of the distal humerus and proximal ulna
with stable hardware positioning. No evidence of acute fracture or
malalignment. Heterotopic ossification from the ventral lower
humerus.
IMPRESSION: Stable posttraumatic and postsurgical findings.  No acute finding.

## 2019-11-04 ENCOUNTER — Emergency Department
Admission: EM | Admit: 2019-11-04 | Discharge: 2019-11-04 | Disposition: A | Discharge status: Home / Self Care | Payer: Self-pay | Attending: Emergency Medicine | Admitting: Emergency Medicine

## 2019-11-04 ENCOUNTER — Encounter: Payer: Self-pay | Admitting: Emergency Medicine

## 2019-11-04 ENCOUNTER — Other Ambulatory Visit: Payer: Self-pay

## 2019-11-04 DIAGNOSIS — F1910 Other psychoactive substance abuse, uncomplicated: Secondary | ICD-10-CM | POA: Insufficient documentation

## 2019-11-04 DIAGNOSIS — Z79899 Other long term (current) drug therapy: Secondary | ICD-10-CM | POA: Insufficient documentation

## 2019-11-04 DIAGNOSIS — F17210 Nicotine dependence, cigarettes, uncomplicated: Secondary | ICD-10-CM | POA: Insufficient documentation

## 2019-11-04 DIAGNOSIS — F909 Attention-deficit hyperactivity disorder, unspecified type: Secondary | ICD-10-CM | POA: Insufficient documentation

## 2019-11-04 NOTE — ED Notes (Signed)
Pt given water with Dr. Scotty Court, MD approval

## 2019-11-04 NOTE — ED Provider Notes (Addendum)
[facility] Emergency Department Provider Note  ____________________________________________  Time seen: Approximately 5:55 PM  I have reviewed the triage vital signs and the nursing notes.   HISTORY  Chief Complaint Drug Overdose    HPI Dylan Chadderdon. is a 44 y.o. male with a history of schizophrenia ADHD hyperlipidemia who comes the ED by EMS for suspected overdose.  Patient reports about 2 PM today he ingested a small packet of a substance that he bought off the Internet.  He does not know what it is.  He denies any complaints at this time.  No chest pain shortness of breath belly pain vomiting diarrhea or headache.  No vision changes.  Denies hallucinations SI or HI.  He states that he thought it would help with his schizophrenia, for which she does not currently take any medications.          Past Medical History:  Diagnosis Date  . ADHD   . Hyperlipemia   . Schizophrenia [facility])      Patient Active Problem List   Diagnosis Date Noted  . Insomnia 09/01/2017  . ADHD 09/01/2017  . 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 ELBOW SURGERY Right      Prior to Admission medications   Medication Sig Start Date End Date Taking? Authorizing Provider  benzonatate (TESSALON) 200 MG capsule Take 1 capsule (200 mg total) by mouth 3 (three) times daily as needed for cough. 04/05/18   Fisher, Linden Dolin, PA-C  dextroamphetamine (DEXEDRINE SPANSULE) 15 MG 24 hr capsule Take 30 mg by mouth daily. 09/23/17   [provider]     Allergies Antihistamines, diphenhydramine-type; Prolixin [fluphenazine]; and Risperidone and related   History reviewed. No pertinent family history.  Social History Social History   Tobacco Use  . Smoking status: Current Every Day Smoker    Packs/day: 1.00    Years: 20.00    Pack years: 20.00    Types: Cigarettes  . Smokeless tobacco: Never Used   Substance Use Topics  . Alcohol use: Yes    Comment: occassional use socially  . 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  ENT:   No sore throat. No rhinorrhea. Cardiovascular:   No chest pain or syncope. Respiratory:   No dyspnea or cough. Gastrointestinal:   Negative for abdominal pain, vomiting and diarrhea.  Musculoskeletal:   Negative for focal pain or swelling All other systems reviewed and are negative except as documented above in ROS and HPI.  ____________________________________________   PHYSICAL EXAM:  VITAL SIGNS: ED Triage Vitals  Enc Vitals Group     BP 11/04/19 1549 (!) 142/83     Pulse Rate 11/04/19 1549 (!) 110     Resp 11/04/19 1549 20     Temp 11/04/19 1549 98.7 F (37.1 C)     Temp Source 11/04/19 1549 Oral     SpO2 11/04/19 1549 95 %     Weight 11/04/19 1546 240 lb (108.9 kg)     Height 11/04/19 1546 6\' 2"  (1.88 m)     Head Circumference --      Peak Flow --      Pain Score 11/04/19 1546 0     Pain Loc --      Pain Edu? --      Excl. in [place]? --  Vital signs reviewed, nursing assessments reviewed.   Constitutional:   Alert and oriented. Non-toxic appearance. Eyes:   Conjunctivae are normal. EOMI. PERRL. ENT      Head:   Normocephalic and atraumatic.      Nose:   Wearing a mask.      Mouth/Throat:   Wearing a mask.      Neck:   No meningismus. Full ROM. Hematological/Lymphatic/Immunilogical:   No cervical lymphadenopathy. Cardiovascular:   RRR. Symmetric bilateral radial and DP pulses.  No murmurs. Cap refill less than 2 seconds. Respiratory:   Normal respiratory effort without tachypnea/retractions. Breath sounds are clear and equal bilaterally. No wheezes/rales/rhonchi. Gastrointestinal:   Soft and nontender. Non distended. There is no CVA tenderness.  No rebound, rigidity, or guarding. Genitourinary:   deferred Musculoskeletal:   Normal range of motion in all  extremities. No joint effusions.  No lower extremity tenderness.  No edema. Neurologic:   Normal speech and language.  Linear thought process, appropriate interactions.  Congruent affect. Steady gait, cerebellar function normal  cranial nerve 3 through 12 intact Motor grossly intact. No acute focal neurologic deficits are appreciated.  Skin:    Skin is warm, dry and intact. No rash noted.  No petechiae, purpura, or bullae.  ____________________________________________    LABS (pertinent positives/negatives) (all labs ordered are listed, but only abnormal results are displayed) Labs Reviewed - No data to display ____________________________________________   EKG  Interpreted by me Sinus tachycardia rate 105.  Normal axis and intervals.  Normal QRS ST segments and T waves.  ____________________________________________    RADIOLOGY  No results found.  ____________________________________________   PROCEDURES Procedures  ____________________________________________    CLINICAL IMPRESSION / ASSESSMENT AND PLAN / ED COURSE  Medications ordered in the ED: Medications - No data to display  Pertinent labs & imaging results that were available during my care of the patient were reviewed by me and considered in my medical decision making (see chart for details).  Dylan Balo. was evaluated in Emergency Department on 11/04/2019 for the symptoms described in the history of present illness. He was evaluated in the context of the global COVID-19 pandemic, which necessitated consideration that the patient might be at risk for infection with the SARS-CoV-2 virus that causes COVID-19. Institutional protocols and algorithms that pertain to the evaluation of patients at risk for COVID-19 are in a state of rapid change based on information released by regulatory bodies including the CDC and federal and state organizations. These policies and algorithms were followed during the patient's  care in the ED.     Clinical Course as of Nov 03 1753  Sat Nov 04, 2019  1618 Pt p/w ingestion of unknown substance for recreational purposes.  He denies taking any medications despite a reported history of schizophrenia.  He is lucid, linear, not hallucinating or an imminent danger to self or others. Not commitable. Will observe for signs of toxicity, after which time pt could be discharged.   [PS]  1753 It's been a total of 4 hours since ingestion. Pt remains AAOX3, lucid, steady gait, clear speech. No evidence of intoxication or adverse effects. Stable for discharge.   [PS]    Clinical Course User Index [PS] Sharman Cheek, MD     ____________________________________________   FINAL CLINICAL IMPRESSION(S) / ED DIAGNOSES    Final diagnoses:  Substance abuse [facility])     ED Discharge Orders    None      Portions of this note were generated  with Scientist, clinical (histocompatibility and immunogenetics). Dictation errors may occur despite best attempts at proofreading.   Sharman Cheek, MD 11/04/19 1757    Sharman Cheek, MD 11/04/19 1757

## 2019-11-04 NOTE — ED Notes (Signed)
Offered pt medications for nausea at this time. Pt refused.

## 2019-11-04 NOTE — ED Triage Notes (Signed)
Pt via EMS from home. Pt called out for OD. Pt took an unknown amount of an unknown liquid. Pt only complaint at this time is thirst. He took the liquid approx an hour and 30 mins ago. Denies pain. Pt A&Ox4 and NAD. Denies SI/HI at this time. Pt has a hx of schizophrenia and acid reflux.

## 2019-11-04 NOTE — Discharge Instructions (Addendum)
Avoid using unknown substances or illicit drugs. Please follow up with your doctor this week to discuss your ongoing symptoms.

## 2019-11-04 NOTE — ED Notes (Signed)
Pt ambulatory with steady gait upon d/c.   Pt signed discharge paper copy.

## 2022-01-19 ENCOUNTER — Encounter: Payer: Self-pay | Admitting: *Deleted

## 2022-01-19 ENCOUNTER — Other Ambulatory Visit: Payer: Self-pay

## 2022-01-19 ENCOUNTER — Emergency Department
Admission: EM | Admit: 2022-01-19 | Discharge: 2022-01-19 | Disposition: A | Discharge status: Home / Self Care | Payer: 59 | Attending: Emergency Medicine | Admitting: Emergency Medicine

## 2022-01-19 DIAGNOSIS — R350 Frequency of micturition: Secondary | ICD-10-CM | POA: Insufficient documentation

## 2022-01-19 LAB — URINALYSIS, ROUTINE W REFLEX MICROSCOPIC
Bilirubin Urine: NEGATIVE
Glucose, UA: NEGATIVE mg/dL
Hgb urine dipstick: NEGATIVE
Ketones, ur: NEGATIVE mg/dL
Leukocytes,Ua: NEGATIVE
Nitrite: NEGATIVE
Protein, ur: NEGATIVE mg/dL
Specific Gravity, Urine: 1.024 (ref 1.005–1.030)
pH: 6 (ref 5.0–8.0)

## 2022-01-19 LAB — CBG MONITORING, ED: Glucose-Capillary: 107 mg/dL — ABNORMAL HIGH (ref 70–99)

## 2022-01-19 MED ORDER — TAMSULOSIN HCL 0.4 MG PO CAPS: 0.4000 mg | ORAL_CAPSULE | Freq: Every day | ORAL | 0 refills | Status: AC

## 2022-01-19 NOTE — ED Notes (Signed)
Bladder scan performed: 34ml post void. MD informed

## 2022-01-19 NOTE — ED Triage Notes (Signed)
Pt reports urinary frequency   no dysuria.  No back pain.  No n/v/  no abd pain.  Pt alert  speech clear.

## 2022-01-19 NOTE — Discharge Instructions (Addendum)
Your urine sample did not suggest UTI and you did not have a significant amount of urinary retention.  Your prostate may be enlarged causing your urinary frequency.  Start taking the Flomax once daily to see if this helps with your symptoms.  This medication can make you somewhat lightheaded upon standing or lowering your blood pressure.  If you start to have the symptoms please make sure you are standing up slowly or discontinue the medication.  Please make sure you are staying hydrated.

## 2022-01-19 NOTE — ED Provider Notes (Signed)
[facility] Provider Note    Event Date/Time   First MD Initiated Contact with Patient 01/19/22 1957     (approximate)   History   Urinary Frequency   HPI  Dylan [name]. is a 46 y.o. male past medical history of HD hyperlipidemia and schizophrenia who presents with urinary frequency.  He notes that he has always had a degree of urinary frequency getting up about every hour to urinate at night but is been worse over the last several days feels like he is going every 15 minutes does have some difficulty with initiating his stream denies incontinence does feel like he is not completely emptying his bladder.  Denies dysuria or hematuria.  Denies any back pain numbness tingling weakness in his legs.    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 --      Excl. in GC? --     Most recent vital signs: Vitals:   01/19/22 1700  BP: 122/88  Pulse: 91  Resp: 20  Temp: 97.7 F (36.5 C)  SpO2: 99%     General: Awake, no distress.  CV:  Good peripheral perfusion.  Resp:  Normal effort.  Abd:  No distention.  Abdomen is soft nontender Neuro:             Awake, Alert, Oriented x 3 no CVA tenderness Other:     ED Results / Procedures / Treatments  Labs (all labs ordered are listed, but only abnormal  results are displayed) Labs Reviewed  URINALYSIS, ROUTINE W REFLEX MICROSCOPIC - Abnormal; Notable for the following components:      Result Value   Color, Urine YELLOW (*)    APPearance HAZY (*)    All other components within normal limits  CBG MONITORING, ED - Abnormal; Notable for the following components:   Glucose-Capillary 107 (*)    All other components within normal limits     EKG     RADIOLOGY    PROCEDURES:  Critical Care performed: No  Procedures  MEDICATIONS ORDERED IN ED: Medications - No data to display   IMPRESSION / MDM / ASSESSMENT AND PLAN / ED COURSE  I reviewed the triage vital signs and the nursing notes.                              Patient's presentation is most consistent with acute complicated illness / injury requiring diagnostic workup.  Differential  diagnosis includes, but is not limited to, BPH, prostatic malignancy, UTI, urethritis  The patient is a 46 year old male presenting with urinary frequency.  He has been going like every 15 minutes at night without dysuria or hematuria.  No incontinence but does feel like he is not completely emptying his bladder and that he has some difficulty initiating his stream.  Does have history of urinary frequency in the past going about hourly at night.  Has never been told that his prostate was enlarged.  His UA is clean I checked a blood sugar to ensure hypoglycemia would not be causing polyuria and this is normal.  Postvoid residual was 19 so no significant urinary retention.  Suspect BPH.  Will start on tamsulosin and refer to urology.      FINAL CLINICAL IMPRESSION(S) / ED DIAGNOSES   Final diagnoses:  Urinary frequency     Rx / DC Orders   ED Discharge Orders          Ordered    tamsulosin (FLOMAX) 0.4 MG CAPS capsule  Daily        01/19/22 2044             Note:  This document was prepared using Dragon voice recognition software and may include unintentional dictation errors.    Georga Hacking, MD 01/19/22 2044

## 2022-02-13 ENCOUNTER — Ambulatory Visit: Payer: 59 | Admitting: Urology

## 2022-02-13 ENCOUNTER — Encounter: Payer: Self-pay | Admitting: Urology

## 2022-02-13 VITALS — BP 134/88 | HR 91 | Ht 74.0 in | Wt 220.0 lb

## 2022-02-13 DIAGNOSIS — R3911 Hesitancy of micturition: Secondary | ICD-10-CM | POA: Diagnosis not present

## 2022-02-13 DIAGNOSIS — R350 Frequency of micturition: Secondary | ICD-10-CM

## 2022-02-13 DIAGNOSIS — N179 Acute kidney failure, unspecified: Secondary | ICD-10-CM

## 2022-02-13 DIAGNOSIS — R109 Unspecified abdominal pain: Secondary | ICD-10-CM | POA: Diagnosis not present

## 2022-02-13 DIAGNOSIS — R399 Unspecified symptoms and signs involving the genitourinary system: Secondary | ICD-10-CM

## 2022-02-13 DIAGNOSIS — R351 Nocturia: Secondary | ICD-10-CM | POA: Diagnosis not present

## 2022-02-13 LAB — URINALYSIS, COMPLETE
Bilirubin, UA: NEGATIVE
Glucose, UA: NEGATIVE
Ketones, UA: NEGATIVE
Leukocytes,UA: NEGATIVE
Nitrite, UA: NEGATIVE
Protein,UA: NEGATIVE
RBC, UA: NEGATIVE
Specific Gravity, UA: 1.02 (ref 1.005–1.030)
Urobilinogen, Ur: 0.2 mg/dL (ref 0.2–1.0)
pH, UA: 6 (ref 5.0–7.5)

## 2022-02-13 LAB — MICROSCOPIC EXAMINATION: Bacteria, UA: NONE SEEN

## 2022-02-13 MED ORDER — TROSPIUM CHLORIDE 20 MG PO TABS: 20.0000 mg | ORAL_TABLET | Freq: Two times a day (BID) | ORAL | 0 refills | Status: AC

## 2022-02-13 NOTE — Progress Notes (Signed)
   02/13/2022 12:37 PM   Dylan Gerald Stabs Jr. [birth year] [id number]  Referring provider: No referring provider defined for this encounter.  Chief Complaint  Patient presents with   Urinary Frequency    HPI: Dylan [name]. is a 46 y.o. male presents for follow-up of a recent ED visit.  Presented to [facility] ED 01/19/2022 complaining of urinary frequency and nocturia every hour that had acutely worsened to voiding every 5-10 minutes.  Also complained of urinary hesitancy PVR was 19 cc; urinalysis unremarkable Started on tamsulosin and urology follow-up recommended Since ED visit he had to discontinue tamsulosin.  Saw his PCP for "throat swelling" and was felt he was having an allergic reaction.  He did not feel the medication improved his symptoms Has occasional scrotal pain and right flank pain No gross hematuria, fever, chills, nausea or vomiting States he has had urinary frequency since being diagnosed with heatstroke in 2013 and had acute renal failure  PMH: Past Medical History:  Diagnosis Date   ADHD    Hyperlipemia    Schizophrenia (HCC)     Surgical History: Past Surgical History:  Procedure Laterality Date   ELBOW SURGERY Right     Home Medications:  Allergies as of 02/13/2022       Reactions   Antihistamines, Diphenhydramine-type Other (See Comments)   Seizures; pt states he can tolerate very low doses (10mg )   Prolixin [fluphenazine] Photosensitivity   Risperidone And Related Other (See Comments)   seizures        Medication List        Accurate as of February 13, 2022 12:37 PM. If you have any questions, ask your nurse or doctor.          STOP taking these medications    benzonatate 200 MG capsule Commonly known as: TESSALON Stopped by: Riki Altes, MD       TAKE these medications    dextroamphetamine 15 MG 24 hr capsule Commonly known as: DEXEDRINE SPANSULE Take 30 mg by mouth daily.   tamsulosin 0.4 MG Caps capsule Commonly known as:  FLOMAX Take 1 capsule (0.4 mg total) by mouth daily.        Allergies:  Allergies  Allergen Reactions   Antihistamines, Diphenhydramine-Type Other (See Comments)    Seizures; pt states he can tolerate very low doses (10mg )   Prolixin [Fluphenazine] Photosensitivity   Risperidone And Related Other (See Comments)    seizures    Family History: History reviewed. No pertinent family history.  Social History:  reports that he has been smoking cigarettes. He has a 20.00 pack-year smoking history. He has never used smokeless tobacco. He reports that he does not currently use alcohol. He reports that he does not use drugs.   Physical Exam: There were no vitals taken for this visit.  Constitutional:  Alert and oriented, No acute distress. HEENT: [place] AT Respiratory: Normal respiratory effort, no increased work of breathing. Neurologic: Grossly intact, no focal deficits, moving all 4 extremities. Psychiatric: Normal mood and affect.  Laboratory Data:  Urinalysis Dipstick/microscopy negative   Assessment & Plan:    1.  Lower urinary tract symptoms Storage related voiding symptoms most bothersome Trial trospium 20 mg twice daily  1 month follow-up symptom check  2.  Right flank pain Schedule RUS    Riki Altes, MD  [facility] [address], [address] [place], [place] [address] [phone]

## 2022-03-04 ENCOUNTER — Other Ambulatory Visit: Payer: Self-pay

## 2022-03-04 ENCOUNTER — Emergency Department
Admission: EM | Admit: 2022-03-04 | Discharge: 2022-03-04 | Disposition: A | Discharge status: Home / Self Care | Payer: 59 | Attending: Emergency Medicine | Admitting: Emergency Medicine

## 2022-03-04 ENCOUNTER — Encounter: Payer: Self-pay | Admitting: *Deleted

## 2022-03-04 DIAGNOSIS — R339 Retention of urine, unspecified: Secondary | ICD-10-CM | POA: Diagnosis present

## 2022-03-04 LAB — URINALYSIS, ROUTINE W REFLEX MICROSCOPIC
Bacteria, UA: NONE SEEN
Bilirubin Urine: NEGATIVE
Glucose, UA: NEGATIVE mg/dL
Hgb urine dipstick: NEGATIVE
Ketones, ur: NEGATIVE mg/dL
Leukocytes,Ua: NEGATIVE
Nitrite: NEGATIVE
Protein, ur: 30 mg/dL — AB
Specific Gravity, Urine: 1.021 (ref 1.005–1.030)
Squamous Epithelial / LPF: NONE SEEN (ref 0–5)
pH: 6 (ref 5.0–8.0)

## 2022-03-04 NOTE — ED Notes (Signed)
Pt given instructions on use of foley and leg bag.  Given follow up instructions regarding his appt w/urology.  Pt verbalized understanding of instructions.  Pt A&O x4 at this time and ambulatory to DC.

## 2022-03-04 NOTE — ED Provider Notes (Signed)
[facility] Provider Note  Patient Contact: 8:57 PM (approximate)   History   Urinary Retention   HPI  Dylan Worth. is a 46 y.o. male who presents the emergency department with acute urinary retention.  Patient states that about a month ago he had some urinary issues, saw his primary care and admitted to urology for PSA.  PSA was within limits.  Patient states that today he started to develop urinary retention that worsened then he has been unable to pee for the last 9 hours.  Patient states that he does take dextroamphetamine for his ADHD and had some cold medications containing dextromethorphan and possibly phenylephrine for a bronchitis type infection.  Patient had no dysuria, hematuria prior to the onset of his retention.  Patient has no pain to include flank pain.  He does have the feeling of needing to urinate.  He was bladder scanned in triage with 200 mL of urine in his bladder.     Physical Exam   Triage Vital Signs: ED Triage Vitals  Enc Vitals Group     BP 03/04/22 2003 128/73     Pulse Rate 03/04/22 2003 78     Resp 03/04/22 2003 20     Temp 03/04/22 2003 98.6 F (37 C)     Temp Source 03/04/22 2003 Oral     SpO2 03/04/22 2003 97 %     Weight 03/04/22 1954 230 lb (104.3 kg)     Height 03/04/22 1954 6\' 2"  (1.88 m)     Head Circumference --      Peak Flow --      Pain Score --      Pain Loc --      Pain Edu? --      Excl. in GC? --     Most recent vital signs: Vitals:   03/04/22 2003 03/04/22 2240  BP: 128/73 (!) 131/90  Pulse: 78 (!) 105  Resp: 20 18  Temp: 98.6 F (37 C) (!) 97.5 F (36.4 C)  SpO2: 97% 97%     General: Alert and in no acute distress.  Cardiovascular:  Good peripheral perfusion Respiratory: Normal respiratory effort without tachypnea or retractions. Lungs CTAB. Good air entry to the bases with no decreased or absent breath sounds. Gastrointestinal: Bowel sounds 4 quadrants. Soft and nontender to  palpation. No guarding or rigidity. No palpable masses. No distention. No CVA tenderness. Musculoskeletal: Full range of motion to all extremities.  Neurologic:  No gross focal neurologic deficits are appreciated.  Skin:   No rash noted Other:   ED Results / Procedures / Treatments   Labs (all labs ordered are listed, but only abnormal results are displayed) Labs Reviewed  URINALYSIS, ROUTINE W REFLEX MICROSCOPIC - Abnormal; Notable for the following components:      Result Value   Color, Urine YELLOW (*)    APPearance HAZY (*)    Protein, ur 30 (*)    All other components within normal limits     EKG     RADIOLOGY    No results found.  PROCEDURES:  Critical Care performed: No  Procedures   MEDICATIONS ORDERED IN ED: Medications - No data to display   IMPRESSION / MDM / ASSESSMENT AND PLAN / ED COURSE  I reviewed the triage vital signs and the nursing notes.  Differential diagnosis includes, but is not limited to, urinary retention, BPH, UTI, medication side effect, nephrolithiasis, neurogenic bladder  Patient's presentation is most consistent with acute presentation with potential threat to life or bodily function.   Patient's diagnosis is consistent with urinary retention, medication side effect.  Patient presented to the emergency department complaining of urinary retention.  Patient states that he had difficulty urinating this morning, had to force himself to urinate.  Patient was able to urinate after noon today.  Patient has 200 mL of urine in his bladder by bladder scan.  Patient takes dextroamphetamine, recently added some cough and cold medication with dextromethorphan and possibly phenylephrine.  Patient with acute urinary retention, likely secondary to medications.  I advised stopping his dextroamphetamine and his cold medications.  Patient has an indwelling catheter at this time.  Urinalysis revealed no evidence of infection.   Follow-up with urology..  Patient is given ED precautions to return to the ED for any worsening or new symptoms.        FINAL CLINICAL IMPRESSION(S) / ED DIAGNOSES   Final diagnoses:  Urinary retention     Rx / DC Orders   ED Discharge Orders     None        Note:  This document was prepared using Dragon voice recognition software and may include unintentional dictation errors.   Lanette Hampshire 03/04/22 2301    Georga Hacking, MD 03/05/22 1725

## 2022-03-04 NOTE — ED Triage Notes (Signed)
Pt has urinary retention.  States unable to void since noon today.  No back pain.  No hx urinary problems.    Pt alert  speech clear.

## 2022-03-04 NOTE — ED Notes (Signed)
Bladder scan 200 mls urine

## 2022-03-10 ENCOUNTER — Emergency Department: Payer: 59

## 2022-03-10 ENCOUNTER — Emergency Department
Admission: EM | Admit: 2022-03-10 | Discharge: 2022-03-10 | Discharge status: Against Medical Advice | Payer: 59 | Attending: Emergency Medicine | Admitting: Emergency Medicine

## 2022-03-10 ENCOUNTER — Other Ambulatory Visit: Payer: Self-pay

## 2022-03-10 DIAGNOSIS — Z5321 Procedure and treatment not carried out due to patient leaving prior to being seen by health care provider: Secondary | ICD-10-CM | POA: Diagnosis not present

## 2022-03-10 DIAGNOSIS — N50819 Testicular pain, unspecified: Secondary | ICD-10-CM | POA: Diagnosis not present

## 2022-03-10 DIAGNOSIS — R300 Dysuria: Secondary | ICD-10-CM | POA: Diagnosis not present

## 2022-03-10 NOTE — ED Triage Notes (Signed)
Pt presents via POV c/o dysuria and testicle pain. Reports had urinary catheter placed last week. Reports dysuria with "trying to push urine into catheter". Also reports intermittent sharp testicle pain. Denies hematuria.

## 2022-03-11 ENCOUNTER — Other Ambulatory Visit: Payer: Self-pay | Admitting: *Deleted

## 2022-03-11 MED ORDER — OXYBUTYNIN CHLORIDE 5 MG PO TABS: 5.0000 mg | ORAL_TABLET | Freq: Three times a day (TID) | ORAL | 0 refills | Status: AC | PRN

## 2022-03-18 ENCOUNTER — Ambulatory Visit: Payer: 59 | Admitting: Urology

## 2022-03-18 ENCOUNTER — Encounter: Payer: Self-pay | Admitting: Urology

## 2022-03-18 ENCOUNTER — Ambulatory Visit (INDEPENDENT_AMBULATORY_CARE_PROVIDER_SITE_OTHER): Payer: 59 | Admitting: Urology

## 2022-03-18 VITALS — BP 142/81 | HR 102 | Ht 74.0 in | Wt 230.0 lb

## 2022-03-18 DIAGNOSIS — R339 Retention of urine, unspecified: Secondary | ICD-10-CM

## 2022-03-18 DIAGNOSIS — R338 Other retention of urine: Secondary | ICD-10-CM | POA: Diagnosis not present

## 2022-03-18 LAB — BLADDER SCAN AMB NON-IMAGING

## 2022-03-18 NOTE — Progress Notes (Signed)
He returns today for follow-up PVR.  It was 21 mL.  We also filled out his FMLA forms.  He will follow-up in 1 month for IPSS and PVR.

## 2022-03-18 NOTE — Progress Notes (Signed)
   03/18/2022 9:04 AM   Dylan [name]. 08-27-1975 [id number]  Referring provider: Fayrene Helper, NP [address] [place],  [place] [address]  Chief Complaint  Patient presents with   Urinary Retention    HPI: 46 y.o. male presents for follow-up visit.  Initially seen 02/13/2022 for storage related voiding symptoms.  PVR was 19 mL.  Had been started on tamsulosin by the ED which he could not tolerate secondary to side effects. Given a trial of trospium 20 mg twice daily He noted marked improvement in his storage related voiding symptoms however presented to the ED 03/04/2022 with inability to void.  Bladder volume was 200 mL and a Foley catheter was placed.  He had been taking sinus medications including phenylephrine He has stopped his trospium.  Apparently given Rx oxybutynin had an ED visit 9/5 for bladder spasm which she has also stopped  PMH: Past Medical History:  Diagnosis Date   ADHD    Hyperlipemia    Schizophrenia (HCC)     Surgical History: Past Surgical History:  Procedure Laterality Date   ELBOW SURGERY Right     Home Medications:  Allergies as of 03/18/2022       Reactions   Antihistamines, Diphenhydramine-type Other (See Comments)   Seizures; pt states he can tolerate very low doses (10mg )   Prolixin [fluphenazine] Photosensitivity   Risperidone And Related Other (See Comments)   seizures        Medication List        Accurate as of March 18, 2022  9:04 AM. If you have any questions, ask your nurse or doctor.          dextroamphetamine 15 MG 24 hr capsule Commonly known as: DEXEDRINE SPANSULE Take 30 mg by mouth daily.   oxybutynin 5 MG tablet Commonly known as: DITROPAN Take 1 tablet (5 mg total) by mouth every 8 (eight) hours as needed for bladder spasms.   trospium 20 MG tablet Commonly known as: SANCTURA Take 1 tablet (20 mg total) by mouth 2 (two) times daily.        Allergies:  Allergies  Allergen Reactions    Antihistamines, Diphenhydramine-Type Other (See Comments)    Seizures; pt states he can tolerate very low doses (10mg )   Prolixin [Fluphenazine] Photosensitivity   Risperidone And Related Other (See Comments)    seizures    Family History: No family history on file.  Social History:  reports that he has been smoking cigarettes. He has a 20.00 pack-year smoking history. He has never used smokeless tobacco. He reports that he does not currently use alcohol. He reports that he does not use drugs.   Physical Exam: BP (!) 142/81   Pulse (!) 102   Ht 6\' 2"  (1.88 m)   Wt 230 lb (104.3 kg)   BMI 29.53 kg/m   Constitutional:  Alert and oriented, No acute distress. HEENT: [place] AT Respiratory: Normal respiratory effort, no increased work of breathing. Neurologic: Grossly intact, no focal deficits, moving all 4 extremities. Psychiatric: Normal mood and affect.   Assessment & Plan:    1.  Urinary retention Most likely secondary to combination of anticholinergic and cold medication Foley catheter removed for voiding trial.  He has a follow-up bladder scan scheduled this afternoon   , MD  [facility] [address] [place], [place] [phone]

## 2022-03-18 NOTE — Progress Notes (Signed)
Catheter Removal  Patient is present today for a catheter removal.  8ml of water was drained from the balloon. A 16FR foley cath was removed from the bladder, no complications were noted. Patient tolerated well.  Performed by: Edyth Glomb, CMA  Follow up/ Additional notes: PM PVR  

## 2022-04-15 ENCOUNTER — Ambulatory Visit: Payer: 59 | Admitting: Urology

## 2022-05-06 NOTE — Progress Notes (Signed)
05/07/2022 9:20 PM   Dylan Pink Jr. December 17, 1975 [id number]  Referring provider: Danelle Berry, NP [address] [place],  [place] [address]  Urological history: 1. Urinary retention -likely secondary to OAB meds and cold medications  Chief Complaint  Patient presents with   Benign Prostatic Hypertrophy    HPI: Dylan Colletta. is a 46 y.o. male who presents today for one month follow up.   He does have issues with urinary frequency, but he states this has been longstanding ever since his heatstroke.  Patient denies any modifying or aggravating factors.  Patient denies any gross hematuria, dysuria or suprapubic. Patient denies any fevers, chills, nausea or vomiting.    I PSS 15/3  PVR 21 mL    IPSS     Row Name 05/07/22 1500         International Prostate Symptom Score   How often have you had the sensation of not emptying your bladder? Less than half the time     How often have you had to urinate less than every two hours? Almost always     How often have you found you stopped and started again several times when you urinated? Less than 1 in 5 times     How often have you found it difficult to postpone urination? Less than 1 in 5 times     How often have you had a weak urinary stream? Less than 1 in 5 times     How often have you had to strain to start urination? Not at All     How many times did you typically get up at night to urinate? 5 Times     Total IPSS Score 15       Quality of Life due to urinary symptoms   If you were to spend the rest of your life with your urinary condition just the way it is now how would you feel about that? Mixed              Score:  1-7 Mild 8-19 Moderate 20-35 Severe    PMH: Past Medical History:  Diagnosis Date   ADHD    Hyperlipemia    Schizophrenia ([place])     Surgical History: Past Surgical History:  Procedure Laterality Date   ELBOW SURGERY Right     Home Medications:  Allergies as of 05/07/2022        Reactions   Antihistamines, Diphenhydramine-type Other (See Comments)   Seizures; pt states he can tolerate very low doses (10mg )   Prolixin [fluphenazine] Photosensitivity   Risperidone And Related Other (See Comments)   seizures        Medication List        Accurate as of May 07, 2022 11:59 PM. If you have any questions, ask your nurse or doctor.          dextroamphetamine 15 MG 24 hr capsule Commonly known as: DEXEDRINE SPANSULE Take 30 mg by mouth daily.   oxybutynin 5 MG tablet Commonly known as: DITROPAN Take 1 tablet (5 mg total) by mouth every 8 (eight) hours as needed for bladder spasms.   trospium 20 MG tablet Commonly known as: SANCTURA Take 1 tablet (20 mg total) by mouth 2 (two) times daily.        Allergies:  Allergies  Allergen Reactions   Antihistamines, Diphenhydramine-Type Other (See Comments)    Seizures; pt states he can tolerate very low doses (10mg )   Prolixin [Fluphenazine]  Photosensitivity   Risperidone And Related Other (See Comments)    seizures    Family History: No family history on file.  Social History:  reports that he has been smoking cigarettes. He has a 20.00 pack-year smoking history. He has never used smokeless tobacco. He reports that he does not currently use alcohol. He reports that he does not use drugs.  ROS: Pertinent ROS in HPI  Physical Exam: BP 116/76   Pulse 75   Ht 6\' 2"  (1.88 m)   Wt 229 lb (103.9 kg)   BMI 29.40 kg/m   Constitutional:  Well nourished. Alert and oriented, No acute distress. HEENT: [place] AT, moist mucus membranes.  Trachea midline Cardiovascular: No clubbing, cyanosis, or edema. Respiratory: Normal respiratory effort, no increased work of breathing. Neurologic: Grossly intact, no focal deficits, moving all 4 extremities. Psychiatric: Normal mood and affect.  Laboratory Data: pending   Pertinent Imaging  05/07/22 15:37  Scan Result 81mL   I have independently reviewed  the films.    Assessment & Plan:    1. Urinary retention -likely due to cold medications and OAB medications  -resolved at this time  2. Microscopic hematuria -UA w/ 6-10 RBC's in the ED  -Explained that the microscopic hematuria may be due to the catheter placement and we should recheck urinalysis to confirm the microscopic hematuria has abated or he could consider renal ultrasound at this time -He stated that he had some financial constraints and will get back to [place] regarding his decision on how to move forward  Return for patient to call for appointment .  These notes generated with voice recognition software. I apologize for typographical errors.  Dylan [name], [place] [address]  [place], [place] [address] [phone]

## 2022-05-07 ENCOUNTER — Ambulatory Visit (INDEPENDENT_AMBULATORY_CARE_PROVIDER_SITE_OTHER): Payer: 59 | Admitting: Urology

## 2022-05-07 VITALS — BP 116/76 | HR 75 | Ht 74.0 in | Wt 229.0 lb

## 2022-05-07 DIAGNOSIS — R338 Other retention of urine: Secondary | ICD-10-CM | POA: Diagnosis not present

## 2022-05-07 DIAGNOSIS — N401 Enlarged prostate with lower urinary tract symptoms: Secondary | ICD-10-CM | POA: Diagnosis not present

## 2022-05-07 DIAGNOSIS — R3129 Other microscopic hematuria: Secondary | ICD-10-CM | POA: Diagnosis not present

## 2022-05-07 DIAGNOSIS — N138 Other obstructive and reflux uropathy: Secondary | ICD-10-CM | POA: Diagnosis not present

## 2022-05-07 LAB — BLADDER SCAN AMB NON-IMAGING

## 2022-05-08 ENCOUNTER — Telehealth: Payer: Self-pay | Admitting: Urology

## 2022-05-10 ENCOUNTER — Encounter: Payer: Self-pay | Admitting: Urology

## 2023-04-22 NOTE — Telephone Encounter (Signed)
error 

## 2023-09-01 ENCOUNTER — Ambulatory Visit: Payer: 59 | Admitting: Cardiology

## 2023-09-01 ENCOUNTER — Encounter: Payer: Self-pay | Admitting: Cardiology

## 2023-09-01 VITALS — BP 128/88 | HR 80 | Ht 73.0 in | Wt 231.0 lb

## 2023-09-01 DIAGNOSIS — F209 Schizophrenia, unspecified: Secondary | ICD-10-CM | POA: Diagnosis not present

## 2023-09-01 DIAGNOSIS — Z1211 Encounter for screening for malignant neoplasm of colon: Secondary | ICD-10-CM

## 2023-09-01 DIAGNOSIS — Z13220 Encounter for screening for lipoid disorders: Secondary | ICD-10-CM

## 2023-09-01 DIAGNOSIS — F909 Attention-deficit hyperactivity disorder, unspecified type: Secondary | ICD-10-CM

## 2023-09-01 DIAGNOSIS — F21 Schizotypal disorder: Secondary | ICD-10-CM

## 2023-09-01 DIAGNOSIS — Z131 Encounter for screening for diabetes mellitus: Secondary | ICD-10-CM | POA: Diagnosis not present

## 2023-09-01 DIAGNOSIS — Z1329 Encounter for screening for other suspected endocrine disorder: Secondary | ICD-10-CM | POA: Diagnosis not present

## 2023-09-01 DIAGNOSIS — I10 Essential (primary) hypertension: Secondary | ICD-10-CM

## 2023-09-01 NOTE — Progress Notes (Signed)
 Established Patient Office Visit  Subjective:  Patient ID: Dylan Balo., male    DOB: 05/13/1976  Age: 48 y.o. MRN: [id number]  Chief Complaint  Patient presents with   Follow-up    Patient in office for overdue follow up. Patient reports doing well, no complaints today. Patient has a history of ADHD and schizophrenia. Patient requesting a referral to a therapist, will send referral to psychiatry.  Patient due for fasting lab work, will return when fasting. Patient due for colon cancer screening, will send order for Cologuard.     No other concerns at this time.   Past Medical History:  Diagnosis Date   ADHD    Hyperlipemia    Schizophrenia (HCC)     Past Surgical History:  Procedure Laterality Date   ELBOW SURGERY Right     Social History   Socioeconomic History   Marital status: Single    Spouse name: Not on file   Number of children: Not on file   Years of education: Not on file   Highest education level: Not on file  Occupational History   Not on file  Tobacco Use   Smoking status: Every Day    Current packs/day: 1.00    Average packs/day: 1 pack/day for 20.0 years (20.0 ttl pk-yrs)    Types: Cigarettes   Smokeless tobacco: Never  Substance and Sexual Activity   Alcohol use: Not Currently    Comment: occassional use socially   Drug use: No    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 No pertinent family history.  Allergies  Allergen Reactions   Antihistamines, Diphenhydramine-Type Other (See Comments)    Seizures; pt states he can tolerate very low doses (10mg )    Prolixin [Fluphenazine] Photosensitivity   Risperidone And Related Other (See Comments)    seizures    Outpatient Medications Prior to Visit  Medication Sig   dextroamphetamine (DEXEDRINE SPANSULE) 15 MG 24 hr capsule Take 30 mg by mouth daily. (Patient not taking: Reported on 03/18/2022)   oxybutynin (DITROPAN) 5 MG tablet Take 1 tablet (5 mg total) by mouth every 8 (eight) hours as needed for bladder spasms. (Patient not taking: Reported on 09/01/2023)   trospium (SANCTURA) 20 MG tablet Take 1 tablet (20 mg total) by mouth 2 (two) times daily. (Patient not taking: Reported on 09/01/2023)   No facility-administered medications prior to visit.    Review of Systems  Constitutional: Negative.   HENT: Negative.    Eyes: Negative.   Respiratory: Negative.  Negative for shortness of breath.   Cardiovascular: Negative.  Negative for chest pain.  Gastrointestinal: Negative.  Negative for abdominal pain, constipation and diarrhea.  Genitourinary: Negative.   Musculoskeletal:  Negative for joint pain and myalgias.  Skin: Negative.   Neurological: Negative.  Negative for dizziness and headaches.  Endo/Heme/Allergies: Negative.   All other systems reviewed and are negative.      Objective:   BP 128/88   Pulse 80   Ht 6\' 1"  (1.854 m)   Wt 231 lb (  104.8 kg)   SpO2 97%   BMI 30.48 kg/m   Vitals:   09/01/23 0946  BP: 128/88  Pulse: 80  Height: 6\' 1"  (1.854 m)  Weight: 231 lb (104.8 kg)  SpO2: 97%  BMI (Calculated): 30.48    Physical Exam Nursing note reviewed.  Constitutional:      Appearance: Normal appearance. He is normal weight.  HENT:     Head: Normocephalic and atraumatic.     Nose: Nose normal.     Mouth/Throat:     Mouth: Mucous membranes are moist.     Pharynx: Oropharynx is clear.  Eyes:     Extraocular Movements: Extraocular movements intact.     Conjunctiva/sclera: Conjunctivae normal.     Pupils: Pupils are equal, round, and reactive to light.   Cardiovascular:     Rate and Rhythm: Normal rate and regular rhythm.     Pulses: Normal pulses.     Heart sounds: Normal heart sounds.  Pulmonary:     Effort: Pulmonary effort is normal.     Breath sounds: Normal breath sounds.  Abdominal:     General: Abdomen is flat. Bowel sounds are normal.     Palpations: Abdomen is soft.  Musculoskeletal:        General: Normal range of motion.     Cervical back: Normal range of motion.  Skin:    General: Skin is warm and dry.  Neurological:     General: No focal deficit present.     Mental Status: He is alert and oriented to person, place, and time.  Psychiatric:        Mood and Affect: Mood normal.        Behavior: Behavior normal.        Thought Content: Thought content normal.        Judgment: Judgment normal.      No results found for any visits on 09/01/23.  No results found for this or any previous visit (from the past 2160 hours).    Assessment & Plan:  Return for fasting lab work.  Cologuard order sent.  Referral to psychiatry sent.   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   Total time spent: 25 minutes  [employer], NP  09/01/2023   This document may have been prepared by Dragon Voice Recognition software and as such may include unintentional dictation errors.

## 2023-09-28 LAB — COLOGUARD: COLOGUARD: NEGATIVE

## 2023-10-01 NOTE — Progress Notes (Signed)
 LM for pt to call back.
